# Patient Record
Sex: Female | Born: 1950 | Race: Black or African American | Hispanic: No | Marital: Married | Smoking: Former smoker
Health system: Southern US, Community
[De-identification: ages and names within clinical notes are randomized; demographics above are authoritative.]

## PROBLEM LIST (undated history)

## (undated) DIAGNOSIS — I10 Essential (primary) hypertension: Secondary | ICD-10-CM

## (undated) DIAGNOSIS — G4700 Insomnia, unspecified: Secondary | ICD-10-CM

## (undated) DIAGNOSIS — Z86010 Personal history of colonic polyps: Principal | ICD-10-CM

## (undated) DIAGNOSIS — M797 Fibromyalgia: Secondary | ICD-10-CM

## (undated) HISTORY — DX: Fibromyalgia: M79.7

## (undated) HISTORY — DX: Personal history of colonic polyps: Z86.010

## (undated) HISTORY — DX: Essential (primary) hypertension: I10

## (undated) HISTORY — PX: ABDOMINAL HYSTERECTOMY: SUR658

## (undated) HISTORY — PX: LAPAROSCOPIC GASTRIC BANDING: SHX1100

## (undated) HISTORY — PX: LAPAROSCOPIC GASTRIC SLEEVE RESECTION: SHX5895

## (undated) HISTORY — DX: Insomnia, unspecified: G47.00

---

## 2011-06-12 DIAGNOSIS — I10 Essential (primary) hypertension: Secondary | ICD-10-CM | POA: Diagnosis not present

## 2011-06-12 DIAGNOSIS — E1129 Type 2 diabetes mellitus with other diabetic kidney complication: Secondary | ICD-10-CM | POA: Diagnosis not present

## 2011-06-15 DIAGNOSIS — E1129 Type 2 diabetes mellitus with other diabetic kidney complication: Secondary | ICD-10-CM | POA: Diagnosis not present

## 2011-06-15 DIAGNOSIS — I10 Essential (primary) hypertension: Secondary | ICD-10-CM | POA: Diagnosis not present

## 2011-06-15 DIAGNOSIS — M1710 Unilateral primary osteoarthritis, unspecified knee: Secondary | ICD-10-CM | POA: Diagnosis not present

## 2011-06-15 DIAGNOSIS — M25569 Pain in unspecified knee: Secondary | ICD-10-CM | POA: Diagnosis not present

## 2011-09-21 DIAGNOSIS — E785 Hyperlipidemia, unspecified: Secondary | ICD-10-CM | POA: Diagnosis not present

## 2011-09-21 DIAGNOSIS — E1129 Type 2 diabetes mellitus with other diabetic kidney complication: Secondary | ICD-10-CM | POA: Diagnosis not present

## 2011-09-21 DIAGNOSIS — I10 Essential (primary) hypertension: Secondary | ICD-10-CM | POA: Diagnosis not present

## 2011-10-19 DIAGNOSIS — M1710 Unilateral primary osteoarthritis, unspecified knee: Secondary | ICD-10-CM | POA: Diagnosis not present

## 2011-10-19 DIAGNOSIS — E669 Obesity, unspecified: Secondary | ICD-10-CM | POA: Diagnosis not present

## 2011-11-16 DIAGNOSIS — E669 Obesity, unspecified: Secondary | ICD-10-CM | POA: Diagnosis not present

## 2011-11-16 DIAGNOSIS — R609 Edema, unspecified: Secondary | ICD-10-CM | POA: Diagnosis not present

## 2011-11-16 DIAGNOSIS — G4700 Insomnia, unspecified: Secondary | ICD-10-CM | POA: Diagnosis not present

## 2011-11-27 DIAGNOSIS — M533 Sacrococcygeal disorders, not elsewhere classified: Secondary | ICD-10-CM | POA: Diagnosis not present

## 2011-11-28 DIAGNOSIS — M1990 Unspecified osteoarthritis, unspecified site: Secondary | ICD-10-CM | POA: Diagnosis not present

## 2011-11-28 DIAGNOSIS — M25569 Pain in unspecified knee: Secondary | ICD-10-CM | POA: Diagnosis not present

## 2011-12-18 DIAGNOSIS — E669 Obesity, unspecified: Secondary | ICD-10-CM | POA: Diagnosis not present

## 2011-12-18 DIAGNOSIS — I10 Essential (primary) hypertension: Secondary | ICD-10-CM | POA: Diagnosis not present

## 2012-01-11 DIAGNOSIS — E785 Hyperlipidemia, unspecified: Secondary | ICD-10-CM | POA: Diagnosis not present

## 2012-01-11 DIAGNOSIS — I10 Essential (primary) hypertension: Secondary | ICD-10-CM | POA: Diagnosis not present

## 2012-01-11 DIAGNOSIS — E1129 Type 2 diabetes mellitus with other diabetic kidney complication: Secondary | ICD-10-CM | POA: Diagnosis not present

## 2012-01-18 DIAGNOSIS — E1129 Type 2 diabetes mellitus with other diabetic kidney complication: Secondary | ICD-10-CM | POA: Diagnosis not present

## 2012-01-18 DIAGNOSIS — E785 Hyperlipidemia, unspecified: Secondary | ICD-10-CM | POA: Diagnosis not present

## 2012-01-18 DIAGNOSIS — I10 Essential (primary) hypertension: Secondary | ICD-10-CM | POA: Diagnosis not present

## 2012-02-15 DIAGNOSIS — I10 Essential (primary) hypertension: Secondary | ICD-10-CM | POA: Diagnosis not present

## 2012-02-15 DIAGNOSIS — Z23 Encounter for immunization: Secondary | ICD-10-CM | POA: Diagnosis not present

## 2012-02-15 DIAGNOSIS — E669 Obesity, unspecified: Secondary | ICD-10-CM | POA: Diagnosis not present

## 2012-03-06 DIAGNOSIS — R6882 Decreased libido: Secondary | ICD-10-CM | POA: Diagnosis not present

## 2012-03-06 DIAGNOSIS — Z1231 Encounter for screening mammogram for malignant neoplasm of breast: Secondary | ICD-10-CM | POA: Diagnosis not present

## 2012-03-06 DIAGNOSIS — Z01419 Encounter for gynecological examination (general) (routine) without abnormal findings: Secondary | ICD-10-CM | POA: Diagnosis not present

## 2012-03-06 DIAGNOSIS — Z124 Encounter for screening for malignant neoplasm of cervix: Secondary | ICD-10-CM | POA: Diagnosis not present

## 2012-03-13 DIAGNOSIS — I10 Essential (primary) hypertension: Secondary | ICD-10-CM | POA: Diagnosis not present

## 2012-03-25 DIAGNOSIS — Z79899 Other long term (current) drug therapy: Secondary | ICD-10-CM | POA: Diagnosis not present

## 2012-03-25 DIAGNOSIS — G894 Chronic pain syndrome: Secondary | ICD-10-CM | POA: Diagnosis not present

## 2012-03-25 DIAGNOSIS — R51 Headache: Secondary | ICD-10-CM | POA: Diagnosis not present

## 2012-03-25 DIAGNOSIS — M4714 Other spondylosis with myelopathy, thoracic region: Secondary | ICD-10-CM | POA: Diagnosis not present

## 2012-04-11 DIAGNOSIS — G894 Chronic pain syndrome: Secondary | ICD-10-CM | POA: Diagnosis not present

## 2012-04-11 DIAGNOSIS — IMO0001 Reserved for inherently not codable concepts without codable children: Secondary | ICD-10-CM | POA: Diagnosis not present

## 2012-04-11 DIAGNOSIS — Z79899 Other long term (current) drug therapy: Secondary | ICD-10-CM | POA: Diagnosis not present

## 2012-04-15 DIAGNOSIS — I10 Essential (primary) hypertension: Secondary | ICD-10-CM | POA: Diagnosis not present

## 2012-04-15 DIAGNOSIS — Z713 Dietary counseling and surveillance: Secondary | ICD-10-CM | POA: Diagnosis not present

## 2012-05-10 DIAGNOSIS — Z5181 Encounter for therapeutic drug level monitoring: Secondary | ICD-10-CM | POA: Diagnosis not present

## 2012-05-10 DIAGNOSIS — G894 Chronic pain syndrome: Secondary | ICD-10-CM | POA: Diagnosis not present

## 2012-05-10 DIAGNOSIS — M5137 Other intervertebral disc degeneration, lumbosacral region: Secondary | ICD-10-CM | POA: Diagnosis not present

## 2012-05-10 DIAGNOSIS — Z79899 Other long term (current) drug therapy: Secondary | ICD-10-CM | POA: Diagnosis not present

## 2012-06-04 DIAGNOSIS — G935 Compression of brain: Secondary | ICD-10-CM | POA: Diagnosis not present

## 2012-06-04 DIAGNOSIS — I10 Essential (primary) hypertension: Secondary | ICD-10-CM | POA: Diagnosis not present

## 2012-06-18 DIAGNOSIS — IMO0001 Reserved for inherently not codable concepts without codable children: Secondary | ICD-10-CM | POA: Diagnosis not present

## 2012-06-18 DIAGNOSIS — G894 Chronic pain syndrome: Secondary | ICD-10-CM | POA: Diagnosis not present

## 2012-06-18 DIAGNOSIS — M5030 Other cervical disc degeneration, unspecified cervical region: Secondary | ICD-10-CM | POA: Diagnosis not present

## 2012-06-18 DIAGNOSIS — M5137 Other intervertebral disc degeneration, lumbosacral region: Secondary | ICD-10-CM | POA: Diagnosis not present

## 2012-06-18 DIAGNOSIS — Z79899 Other long term (current) drug therapy: Secondary | ICD-10-CM | POA: Diagnosis not present

## 2012-06-24 DIAGNOSIS — E1129 Type 2 diabetes mellitus with other diabetic kidney complication: Secondary | ICD-10-CM | POA: Diagnosis not present

## 2012-06-24 DIAGNOSIS — I10 Essential (primary) hypertension: Secondary | ICD-10-CM | POA: Diagnosis not present

## 2012-06-24 DIAGNOSIS — E785 Hyperlipidemia, unspecified: Secondary | ICD-10-CM | POA: Diagnosis not present

## 2012-07-09 DIAGNOSIS — Z0189 Encounter for other specified special examinations: Secondary | ICD-10-CM | POA: Diagnosis not present

## 2012-07-09 DIAGNOSIS — Z7182 Exercise counseling: Secondary | ICD-10-CM | POA: Diagnosis not present

## 2012-07-09 DIAGNOSIS — Z5181 Encounter for therapeutic drug level monitoring: Secondary | ICD-10-CM | POA: Diagnosis not present

## 2012-07-09 DIAGNOSIS — Z79899 Other long term (current) drug therapy: Secondary | ICD-10-CM | POA: Diagnosis not present

## 2012-07-19 DIAGNOSIS — G4730 Sleep apnea, unspecified: Secondary | ICD-10-CM | POA: Diagnosis not present

## 2012-07-19 DIAGNOSIS — R5382 Chronic fatigue, unspecified: Secondary | ICD-10-CM | POA: Diagnosis not present

## 2012-07-19 DIAGNOSIS — G4710 Hypersomnia, unspecified: Secondary | ICD-10-CM | POA: Diagnosis not present

## 2012-07-19 DIAGNOSIS — G479 Sleep disorder, unspecified: Secondary | ICD-10-CM | POA: Diagnosis not present

## 2012-07-19 DIAGNOSIS — G4700 Insomnia, unspecified: Secondary | ICD-10-CM | POA: Diagnosis not present

## 2012-07-24 DIAGNOSIS — M5137 Other intervertebral disc degeneration, lumbosacral region: Secondary | ICD-10-CM | POA: Diagnosis not present

## 2012-07-24 DIAGNOSIS — M4714 Other spondylosis with myelopathy, thoracic region: Secondary | ICD-10-CM | POA: Diagnosis not present

## 2012-07-24 DIAGNOSIS — Z5181 Encounter for therapeutic drug level monitoring: Secondary | ICD-10-CM | POA: Diagnosis not present

## 2012-07-24 DIAGNOSIS — M5030 Other cervical disc degeneration, unspecified cervical region: Secondary | ICD-10-CM | POA: Diagnosis not present

## 2012-07-24 DIAGNOSIS — Z79899 Other long term (current) drug therapy: Secondary | ICD-10-CM | POA: Diagnosis not present

## 2012-07-24 DIAGNOSIS — G894 Chronic pain syndrome: Secondary | ICD-10-CM | POA: Diagnosis not present

## 2012-08-21 DIAGNOSIS — F329 Major depressive disorder, single episode, unspecified: Secondary | ICD-10-CM | POA: Diagnosis not present

## 2012-08-22 DIAGNOSIS — F329 Major depressive disorder, single episode, unspecified: Secondary | ICD-10-CM | POA: Diagnosis not present

## 2012-08-28 DIAGNOSIS — G894 Chronic pain syndrome: Secondary | ICD-10-CM | POA: Diagnosis not present

## 2012-08-28 DIAGNOSIS — Z5181 Encounter for therapeutic drug level monitoring: Secondary | ICD-10-CM | POA: Diagnosis not present

## 2012-08-28 DIAGNOSIS — M5137 Other intervertebral disc degeneration, lumbosacral region: Secondary | ICD-10-CM | POA: Diagnosis not present

## 2012-08-28 DIAGNOSIS — M5030 Other cervical disc degeneration, unspecified cervical region: Secondary | ICD-10-CM | POA: Diagnosis not present

## 2012-08-28 DIAGNOSIS — Z79899 Other long term (current) drug therapy: Secondary | ICD-10-CM | POA: Diagnosis not present

## 2012-09-30 DIAGNOSIS — E785 Hyperlipidemia, unspecified: Secondary | ICD-10-CM | POA: Diagnosis not present

## 2012-09-30 DIAGNOSIS — E1129 Type 2 diabetes mellitus with other diabetic kidney complication: Secondary | ICD-10-CM | POA: Diagnosis not present

## 2012-09-30 DIAGNOSIS — I10 Essential (primary) hypertension: Secondary | ICD-10-CM | POA: Diagnosis not present

## 2012-10-10 DIAGNOSIS — Z01818 Encounter for other preprocedural examination: Secondary | ICD-10-CM | POA: Diagnosis not present

## 2012-10-10 DIAGNOSIS — E119 Type 2 diabetes mellitus without complications: Secondary | ICD-10-CM | POA: Diagnosis not present

## 2012-10-15 DIAGNOSIS — G4733 Obstructive sleep apnea (adult) (pediatric): Secondary | ICD-10-CM | POA: Diagnosis not present

## 2012-10-15 DIAGNOSIS — Z6841 Body Mass Index (BMI) 40.0 and over, adult: Secondary | ICD-10-CM | POA: Diagnosis not present

## 2012-10-15 DIAGNOSIS — M5030 Other cervical disc degeneration, unspecified cervical region: Secondary | ICD-10-CM | POA: Diagnosis present

## 2012-10-15 DIAGNOSIS — K219 Gastro-esophageal reflux disease without esophagitis: Secondary | ICD-10-CM | POA: Diagnosis present

## 2012-10-15 DIAGNOSIS — K449 Diaphragmatic hernia without obstruction or gangrene: Secondary | ICD-10-CM | POA: Diagnosis not present

## 2012-10-15 DIAGNOSIS — F3289 Other specified depressive episodes: Secondary | ICD-10-CM | POA: Diagnosis not present

## 2012-10-15 DIAGNOSIS — G894 Chronic pain syndrome: Secondary | ICD-10-CM | POA: Diagnosis not present

## 2012-10-15 DIAGNOSIS — I10 Essential (primary) hypertension: Secondary | ICD-10-CM | POA: Diagnosis present

## 2012-10-15 DIAGNOSIS — F329 Major depressive disorder, single episode, unspecified: Secondary | ICD-10-CM | POA: Diagnosis present

## 2012-10-15 DIAGNOSIS — M5137 Other intervertebral disc degeneration, lumbosacral region: Secondary | ICD-10-CM | POA: Diagnosis present

## 2012-10-28 DIAGNOSIS — Z5181 Encounter for therapeutic drug level monitoring: Secondary | ICD-10-CM | POA: Diagnosis not present

## 2012-10-28 DIAGNOSIS — M5030 Other cervical disc degeneration, unspecified cervical region: Secondary | ICD-10-CM | POA: Diagnosis not present

## 2012-10-28 DIAGNOSIS — G894 Chronic pain syndrome: Secondary | ICD-10-CM | POA: Diagnosis not present

## 2012-10-28 DIAGNOSIS — Z79899 Other long term (current) drug therapy: Secondary | ICD-10-CM | POA: Diagnosis not present

## 2012-11-14 DIAGNOSIS — R7989 Other specified abnormal findings of blood chemistry: Secondary | ICD-10-CM | POA: Diagnosis not present

## 2012-11-14 DIAGNOSIS — Z0189 Encounter for other specified special examinations: Secondary | ICD-10-CM | POA: Diagnosis not present

## 2012-12-12 DIAGNOSIS — R1310 Dysphagia, unspecified: Secondary | ICD-10-CM | POA: Diagnosis not present

## 2012-12-26 DIAGNOSIS — R1310 Dysphagia, unspecified: Secondary | ICD-10-CM | POA: Diagnosis not present

## 2012-12-26 DIAGNOSIS — K225 Diverticulum of esophagus, acquired: Secondary | ICD-10-CM | POA: Diagnosis not present

## 2012-12-26 DIAGNOSIS — R0982 Postnasal drip: Secondary | ICD-10-CM | POA: Diagnosis not present

## 2012-12-26 DIAGNOSIS — K219 Gastro-esophageal reflux disease without esophagitis: Secondary | ICD-10-CM | POA: Diagnosis not present

## 2012-12-27 DIAGNOSIS — G894 Chronic pain syndrome: Secondary | ICD-10-CM | POA: Diagnosis not present

## 2012-12-27 DIAGNOSIS — Z79899 Other long term (current) drug therapy: Secondary | ICD-10-CM | POA: Diagnosis not present

## 2012-12-27 DIAGNOSIS — Z5181 Encounter for therapeutic drug level monitoring: Secondary | ICD-10-CM | POA: Diagnosis not present

## 2012-12-27 DIAGNOSIS — M5137 Other intervertebral disc degeneration, lumbosacral region: Secondary | ICD-10-CM | POA: Diagnosis not present

## 2012-12-27 DIAGNOSIS — M5030 Other cervical disc degeneration, unspecified cervical region: Secondary | ICD-10-CM | POA: Diagnosis not present

## 2013-01-01 DIAGNOSIS — E1129 Type 2 diabetes mellitus with other diabetic kidney complication: Secondary | ICD-10-CM | POA: Diagnosis not present

## 2013-01-01 DIAGNOSIS — I10 Essential (primary) hypertension: Secondary | ICD-10-CM | POA: Diagnosis not present

## 2013-01-06 DIAGNOSIS — E1129 Type 2 diabetes mellitus with other diabetic kidney complication: Secondary | ICD-10-CM | POA: Diagnosis not present

## 2013-01-06 DIAGNOSIS — I10 Essential (primary) hypertension: Secondary | ICD-10-CM | POA: Diagnosis not present

## 2013-01-06 DIAGNOSIS — E785 Hyperlipidemia, unspecified: Secondary | ICD-10-CM | POA: Diagnosis not present

## 2013-02-24 DIAGNOSIS — M4310 Spondylolisthesis, site unspecified: Secondary | ICD-10-CM | POA: Diagnosis not present

## 2013-02-24 DIAGNOSIS — M4802 Spinal stenosis, cervical region: Secondary | ICD-10-CM | POA: Diagnosis not present

## 2013-02-24 DIAGNOSIS — M5030 Other cervical disc degeneration, unspecified cervical region: Secondary | ICD-10-CM | POA: Diagnosis not present

## 2013-02-24 DIAGNOSIS — Z79899 Other long term (current) drug therapy: Secondary | ICD-10-CM | POA: Diagnosis not present

## 2013-02-24 DIAGNOSIS — Z5181 Encounter for therapeutic drug level monitoring: Secondary | ICD-10-CM | POA: Diagnosis not present

## 2013-02-24 DIAGNOSIS — G894 Chronic pain syndrome: Secondary | ICD-10-CM | POA: Diagnosis not present

## 2013-04-01 DIAGNOSIS — R51 Headache: Secondary | ICD-10-CM | POA: Diagnosis not present

## 2013-04-01 DIAGNOSIS — Q398 Other congenital malformations of esophagus: Secondary | ICD-10-CM | POA: Diagnosis not present

## 2013-04-01 DIAGNOSIS — R0982 Postnasal drip: Secondary | ICD-10-CM | POA: Diagnosis not present

## 2013-04-01 DIAGNOSIS — R439 Unspecified disturbances of smell and taste: Secondary | ICD-10-CM | POA: Diagnosis not present

## 2013-04-08 DIAGNOSIS — J343 Hypertrophy of nasal turbinates: Secondary | ICD-10-CM | POA: Diagnosis not present

## 2013-04-08 DIAGNOSIS — J3489 Other specified disorders of nose and nasal sinuses: Secondary | ICD-10-CM | POA: Diagnosis not present

## 2013-04-08 DIAGNOSIS — J329 Chronic sinusitis, unspecified: Secondary | ICD-10-CM | POA: Diagnosis not present

## 2013-04-08 DIAGNOSIS — R6889 Other general symptoms and signs: Secondary | ICD-10-CM | POA: Diagnosis not present

## 2013-04-08 DIAGNOSIS — R439 Unspecified disturbances of smell and taste: Secondary | ICD-10-CM | POA: Diagnosis not present

## 2013-04-08 DIAGNOSIS — J342 Deviated nasal septum: Secondary | ICD-10-CM | POA: Diagnosis not present

## 2013-05-07 DIAGNOSIS — Z23 Encounter for immunization: Secondary | ICD-10-CM | POA: Diagnosis not present

## 2013-05-30 DIAGNOSIS — R6882 Decreased libido: Secondary | ICD-10-CM | POA: Diagnosis not present

## 2013-05-30 DIAGNOSIS — Z1231 Encounter for screening mammogram for malignant neoplasm of breast: Secondary | ICD-10-CM | POA: Diagnosis not present

## 2013-06-27 DIAGNOSIS — M5137 Other intervertebral disc degeneration, lumbosacral region: Secondary | ICD-10-CM | POA: Diagnosis not present

## 2013-06-27 DIAGNOSIS — Z5181 Encounter for therapeutic drug level monitoring: Secondary | ICD-10-CM | POA: Diagnosis not present

## 2013-06-27 DIAGNOSIS — M4310 Spondylolisthesis, site unspecified: Secondary | ICD-10-CM | POA: Diagnosis not present

## 2013-06-27 DIAGNOSIS — Z79899 Other long term (current) drug therapy: Secondary | ICD-10-CM | POA: Diagnosis not present

## 2013-06-27 DIAGNOSIS — M5030 Other cervical disc degeneration, unspecified cervical region: Secondary | ICD-10-CM | POA: Diagnosis not present

## 2013-06-27 DIAGNOSIS — G894 Chronic pain syndrome: Secondary | ICD-10-CM | POA: Diagnosis not present

## 2013-07-16 DIAGNOSIS — I10 Essential (primary) hypertension: Secondary | ICD-10-CM | POA: Diagnosis not present

## 2013-07-16 DIAGNOSIS — E1129 Type 2 diabetes mellitus with other diabetic kidney complication: Secondary | ICD-10-CM | POA: Diagnosis not present

## 2013-07-25 DIAGNOSIS — Z5181 Encounter for therapeutic drug level monitoring: Secondary | ICD-10-CM | POA: Diagnosis not present

## 2013-07-25 DIAGNOSIS — IMO0001 Reserved for inherently not codable concepts without codable children: Secondary | ICD-10-CM | POA: Diagnosis not present

## 2013-07-25 DIAGNOSIS — Z79899 Other long term (current) drug therapy: Secondary | ICD-10-CM | POA: Diagnosis not present

## 2013-07-25 DIAGNOSIS — M461 Sacroiliitis, not elsewhere classified: Secondary | ICD-10-CM | POA: Diagnosis not present

## 2013-07-25 DIAGNOSIS — M5137 Other intervertebral disc degeneration, lumbosacral region: Secondary | ICD-10-CM | POA: Diagnosis not present

## 2013-07-25 DIAGNOSIS — G894 Chronic pain syndrome: Secondary | ICD-10-CM | POA: Diagnosis not present

## 2013-07-25 DIAGNOSIS — M4310 Spondylolisthesis, site unspecified: Secondary | ICD-10-CM | POA: Diagnosis not present

## 2013-07-25 DIAGNOSIS — M4802 Spinal stenosis, cervical region: Secondary | ICD-10-CM | POA: Diagnosis not present

## 2013-07-25 DIAGNOSIS — M533 Sacrococcygeal disorders, not elsewhere classified: Secondary | ICD-10-CM | POA: Diagnosis not present

## 2013-07-25 DIAGNOSIS — M5030 Other cervical disc degeneration, unspecified cervical region: Secondary | ICD-10-CM | POA: Diagnosis not present

## 2013-08-11 DIAGNOSIS — E785 Hyperlipidemia, unspecified: Secondary | ICD-10-CM | POA: Diagnosis not present

## 2013-08-11 DIAGNOSIS — E1129 Type 2 diabetes mellitus with other diabetic kidney complication: Secondary | ICD-10-CM | POA: Diagnosis not present

## 2013-08-11 DIAGNOSIS — I10 Essential (primary) hypertension: Secondary | ICD-10-CM | POA: Diagnosis not present

## 2013-08-11 DIAGNOSIS — N183 Chronic kidney disease, stage 3 unspecified: Secondary | ICD-10-CM | POA: Diagnosis not present

## 2013-09-25 DIAGNOSIS — L600 Ingrowing nail: Secondary | ICD-10-CM | POA: Diagnosis not present

## 2013-09-25 DIAGNOSIS — B351 Tinea unguium: Secondary | ICD-10-CM | POA: Diagnosis not present

## 2013-10-17 DIAGNOSIS — G894 Chronic pain syndrome: Secondary | ICD-10-CM | POA: Diagnosis not present

## 2013-10-17 DIAGNOSIS — M4802 Spinal stenosis, cervical region: Secondary | ICD-10-CM | POA: Diagnosis not present

## 2013-10-17 DIAGNOSIS — M5030 Other cervical disc degeneration, unspecified cervical region: Secondary | ICD-10-CM | POA: Diagnosis not present

## 2013-10-17 DIAGNOSIS — M5137 Other intervertebral disc degeneration, lumbosacral region: Secondary | ICD-10-CM | POA: Diagnosis not present

## 2013-12-11 DIAGNOSIS — M546 Pain in thoracic spine: Secondary | ICD-10-CM | POA: Diagnosis not present

## 2013-12-11 DIAGNOSIS — Z5181 Encounter for therapeutic drug level monitoring: Secondary | ICD-10-CM | POA: Diagnosis not present

## 2013-12-11 DIAGNOSIS — IMO0001 Reserved for inherently not codable concepts without codable children: Secondary | ICD-10-CM | POA: Diagnosis not present

## 2013-12-11 DIAGNOSIS — G894 Chronic pain syndrome: Secondary | ICD-10-CM | POA: Diagnosis not present

## 2014-03-02 DIAGNOSIS — G894 Chronic pain syndrome: Secondary | ICD-10-CM | POA: Diagnosis not present

## 2014-03-02 DIAGNOSIS — M546 Pain in thoracic spine: Secondary | ICD-10-CM | POA: Diagnosis not present

## 2014-03-02 DIAGNOSIS — Z5181 Encounter for therapeutic drug level monitoring: Secondary | ICD-10-CM | POA: Diagnosis not present

## 2014-03-02 DIAGNOSIS — M791 Myalgia: Secondary | ICD-10-CM | POA: Diagnosis not present

## 2014-03-09 DIAGNOSIS — Z0000 Encounter for general adult medical examination without abnormal findings: Secondary | ICD-10-CM | POA: Diagnosis not present

## 2014-03-09 DIAGNOSIS — Z23 Encounter for immunization: Secondary | ICD-10-CM | POA: Diagnosis not present

## 2014-03-09 DIAGNOSIS — Z1389 Encounter for screening for other disorder: Secondary | ICD-10-CM | POA: Diagnosis not present

## 2014-03-09 DIAGNOSIS — E538 Deficiency of other specified B group vitamins: Secondary | ICD-10-CM | POA: Diagnosis not present

## 2014-03-09 DIAGNOSIS — I10 Essential (primary) hypertension: Secondary | ICD-10-CM | POA: Diagnosis not present

## 2014-03-09 DIAGNOSIS — E1121 Type 2 diabetes mellitus with diabetic nephropathy: Secondary | ICD-10-CM | POA: Diagnosis not present

## 2014-03-09 DIAGNOSIS — M859 Disorder of bone density and structure, unspecified: Secondary | ICD-10-CM | POA: Diagnosis not present

## 2014-03-16 DIAGNOSIS — I10 Essential (primary) hypertension: Secondary | ICD-10-CM | POA: Diagnosis not present

## 2014-03-16 DIAGNOSIS — E785 Hyperlipidemia, unspecified: Secondary | ICD-10-CM | POA: Diagnosis not present

## 2014-03-16 DIAGNOSIS — N183 Chronic kidney disease, stage 3 (moderate): Secondary | ICD-10-CM | POA: Diagnosis not present

## 2014-03-16 DIAGNOSIS — M859 Disorder of bone density and structure, unspecified: Secondary | ICD-10-CM | POA: Diagnosis not present

## 2014-04-02 DIAGNOSIS — M5134 Other intervertebral disc degeneration, thoracic region: Secondary | ICD-10-CM | POA: Diagnosis not present

## 2014-04-02 DIAGNOSIS — M47814 Spondylosis without myelopathy or radiculopathy, thoracic region: Secondary | ICD-10-CM | POA: Diagnosis not present

## 2014-04-02 DIAGNOSIS — M546 Pain in thoracic spine: Secondary | ICD-10-CM | POA: Diagnosis not present

## 2014-04-23 DIAGNOSIS — Z23 Encounter for immunization: Secondary | ICD-10-CM | POA: Diagnosis not present

## 2014-06-04 DIAGNOSIS — M546 Pain in thoracic spine: Secondary | ICD-10-CM | POA: Diagnosis not present

## 2014-06-04 DIAGNOSIS — G894 Chronic pain syndrome: Secondary | ICD-10-CM | POA: Diagnosis not present

## 2014-06-04 DIAGNOSIS — M5134 Other intervertebral disc degeneration, thoracic region: Secondary | ICD-10-CM | POA: Diagnosis not present

## 2014-06-05 DIAGNOSIS — Z1231 Encounter for screening mammogram for malignant neoplasm of breast: Secondary | ICD-10-CM | POA: Diagnosis not present

## 2014-06-05 DIAGNOSIS — Z1289 Encounter for screening for malignant neoplasm of other sites: Secondary | ICD-10-CM | POA: Diagnosis not present

## 2014-08-26 DIAGNOSIS — Z79899 Other long term (current) drug therapy: Secondary | ICD-10-CM | POA: Diagnosis not present

## 2014-08-26 DIAGNOSIS — M546 Pain in thoracic spine: Secondary | ICD-10-CM | POA: Diagnosis not present

## 2014-08-26 DIAGNOSIS — Z5181 Encounter for therapeutic drug level monitoring: Secondary | ICD-10-CM | POA: Diagnosis not present

## 2014-08-26 DIAGNOSIS — G894 Chronic pain syndrome: Secondary | ICD-10-CM | POA: Diagnosis not present

## 2014-08-26 DIAGNOSIS — M791 Myalgia: Secondary | ICD-10-CM | POA: Diagnosis not present

## 2014-08-26 DIAGNOSIS — M5134 Other intervertebral disc degeneration, thoracic region: Secondary | ICD-10-CM | POA: Diagnosis not present

## 2014-08-26 DIAGNOSIS — M4802 Spinal stenosis, cervical region: Secondary | ICD-10-CM | POA: Diagnosis not present

## 2014-09-17 DIAGNOSIS — D81818 Other biotin-dependent carboxylase deficiency: Secondary | ICD-10-CM | POA: Diagnosis not present

## 2014-09-17 DIAGNOSIS — E785 Hyperlipidemia, unspecified: Secondary | ICD-10-CM | POA: Diagnosis not present

## 2014-09-17 DIAGNOSIS — M859 Disorder of bone density and structure, unspecified: Secondary | ICD-10-CM | POA: Diagnosis not present

## 2014-09-17 DIAGNOSIS — I10 Essential (primary) hypertension: Secondary | ICD-10-CM | POA: Diagnosis not present

## 2014-09-25 DIAGNOSIS — G894 Chronic pain syndrome: Secondary | ICD-10-CM | POA: Diagnosis not present

## 2014-09-25 DIAGNOSIS — Z5181 Encounter for therapeutic drug level monitoring: Secondary | ICD-10-CM | POA: Diagnosis not present

## 2014-09-25 DIAGNOSIS — M5030 Other cervical disc degeneration, unspecified cervical region: Secondary | ICD-10-CM | POA: Diagnosis not present

## 2014-09-25 DIAGNOSIS — M546 Pain in thoracic spine: Secondary | ICD-10-CM | POA: Diagnosis not present

## 2014-09-25 DIAGNOSIS — M4317 Spondylolisthesis, lumbosacral region: Secondary | ICD-10-CM | POA: Diagnosis not present

## 2014-09-25 DIAGNOSIS — M791 Myalgia: Secondary | ICD-10-CM | POA: Diagnosis not present

## 2014-09-25 DIAGNOSIS — M5134 Other intervertebral disc degeneration, thoracic region: Secondary | ICD-10-CM | POA: Diagnosis not present

## 2014-09-28 DIAGNOSIS — E785 Hyperlipidemia, unspecified: Secondary | ICD-10-CM | POA: Diagnosis not present

## 2014-09-28 DIAGNOSIS — N183 Chronic kidney disease, stage 3 (moderate): Secondary | ICD-10-CM | POA: Diagnosis not present

## 2014-09-28 DIAGNOSIS — I10 Essential (primary) hypertension: Secondary | ICD-10-CM | POA: Diagnosis not present

## 2014-09-28 DIAGNOSIS — M8580 Other specified disorders of bone density and structure, unspecified site: Secondary | ICD-10-CM | POA: Diagnosis not present

## 2014-09-29 ENCOUNTER — Encounter: Payer: Self-pay | Admitting: Internal Medicine

## 2014-10-22 DIAGNOSIS — G44229 Chronic tension-type headache, not intractable: Secondary | ICD-10-CM | POA: Diagnosis not present

## 2014-10-22 DIAGNOSIS — R432 Parageusia: Secondary | ICD-10-CM | POA: Diagnosis not present

## 2014-10-22 DIAGNOSIS — R51 Headache: Secondary | ICD-10-CM | POA: Diagnosis not present

## 2014-11-20 ENCOUNTER — Encounter: Payer: Self-pay | Admitting: Internal Medicine

## 2014-11-25 DIAGNOSIS — G894 Chronic pain syndrome: Secondary | ICD-10-CM | POA: Diagnosis not present

## 2014-11-25 DIAGNOSIS — M4802 Spinal stenosis, cervical region: Secondary | ICD-10-CM | POA: Diagnosis not present

## 2014-11-25 DIAGNOSIS — M545 Low back pain: Secondary | ICD-10-CM | POA: Diagnosis not present

## 2014-11-25 DIAGNOSIS — M546 Pain in thoracic spine: Secondary | ICD-10-CM | POA: Diagnosis not present

## 2014-11-25 DIAGNOSIS — M5030 Other cervical disc degeneration, unspecified cervical region: Secondary | ICD-10-CM | POA: Diagnosis not present

## 2014-11-25 DIAGNOSIS — M5134 Other intervertebral disc degeneration, thoracic region: Secondary | ICD-10-CM | POA: Diagnosis not present

## 2014-11-25 DIAGNOSIS — M5137 Other intervertebral disc degeneration, lumbosacral region: Secondary | ICD-10-CM | POA: Diagnosis not present

## 2014-11-25 DIAGNOSIS — Z5181 Encounter for therapeutic drug level monitoring: Secondary | ICD-10-CM | POA: Diagnosis not present

## 2014-12-11 ENCOUNTER — Encounter: Payer: Self-pay | Admitting: Internal Medicine

## 2015-01-26 ENCOUNTER — Ambulatory Visit (AMBULATORY_SURGERY_CENTER): Payer: Self-pay

## 2015-01-26 VITALS — Ht 62.0 in | Wt 165.8 lb

## 2015-01-26 DIAGNOSIS — Z1211 Encounter for screening for malignant neoplasm of colon: Secondary | ICD-10-CM

## 2015-01-26 NOTE — Progress Notes (Signed)
No allergies to eggs or soy No past problems with anesthesia No diet/weight loss meds No home oxygen  Refused emmi 

## 2015-02-08 ENCOUNTER — Encounter: Payer: Self-pay | Admitting: Internal Medicine

## 2015-02-08 ENCOUNTER — Telehealth: Payer: Self-pay | Admitting: Internal Medicine

## 2015-02-08 DIAGNOSIS — Z23 Encounter for immunization: Secondary | ICD-10-CM | POA: Diagnosis not present

## 2015-02-08 NOTE — Telephone Encounter (Signed)
No charge. 

## 2015-02-15 DIAGNOSIS — M546 Pain in thoracic spine: Secondary | ICD-10-CM | POA: Diagnosis not present

## 2015-02-15 DIAGNOSIS — M542 Cervicalgia: Secondary | ICD-10-CM | POA: Diagnosis not present

## 2015-02-15 DIAGNOSIS — M5030 Other cervical disc degeneration, unspecified cervical region: Secondary | ICD-10-CM | POA: Diagnosis not present

## 2015-02-15 DIAGNOSIS — G894 Chronic pain syndrome: Secondary | ICD-10-CM | POA: Diagnosis not present

## 2015-02-15 DIAGNOSIS — M545 Low back pain: Secondary | ICD-10-CM | POA: Diagnosis not present

## 2015-02-17 ENCOUNTER — Encounter: Payer: Self-pay | Admitting: Internal Medicine

## 2015-02-17 ENCOUNTER — Ambulatory Visit (AMBULATORY_SURGERY_CENTER): Payer: Medicare Other | Admitting: Internal Medicine

## 2015-02-17 VITALS — BP 183/88 | HR 61 | Temp 95.8°F | Resp 15 | Ht 62.0 in | Wt 165.0 lb

## 2015-02-17 DIAGNOSIS — Z1211 Encounter for screening for malignant neoplasm of colon: Secondary | ICD-10-CM

## 2015-02-17 DIAGNOSIS — I10 Essential (primary) hypertension: Secondary | ICD-10-CM | POA: Diagnosis not present

## 2015-02-17 DIAGNOSIS — D124 Benign neoplasm of descending colon: Secondary | ICD-10-CM | POA: Diagnosis not present

## 2015-02-17 DIAGNOSIS — D120 Benign neoplasm of cecum: Secondary | ICD-10-CM

## 2015-02-17 MED ORDER — SODIUM CHLORIDE 0.9 % IV SOLN: 500.0000 mL | INTRAVENOUS | Status: DC

## 2015-02-17 NOTE — Patient Instructions (Addendum)
I found and removed 2 small polyps that look benign.  You also have a condition called diverticulosis - common and not usually a problem. Please read the handout provided.  I will let you know pathology results and when to have another routine colonoscopy by mail.  I appreciate the opportunity to care for you. Gatha Mayer, MD, [facility]  Discharge instructions given. Handouts on polyps and diverticulosis. Resume previous medications. YOU HAD AN ENDOSCOPIC PROCEDURE TODAY AT [facility]:   Refer to the procedure report that was given to you for any specific questions about what was found during the examination.  If the procedure report does not answer your questions, please call your gastroenterologist to clarify.  If you requested that your care partner not be given the details of your procedure findings, then the procedure report has been included in a sealed envelope for you to review at your convenience later.  YOU SHOULD EXPECT: Some feelings of bloating in the abdomen. Passage of more gas than usual.  Walking can help get rid of the air that was put into your GI tract during the procedure and reduce the bloating. If you had a lower endoscopy (such as a colonoscopy or flexible sigmoidoscopy) you may notice spotting of blood in your stool or on the toilet paper. If you underwent a bowel prep for your procedure, you may not have a normal bowel movement for a few days.  Please Note:  You might notice some irritation and congestion in your nose or some drainage.  This is from the oxygen used during your procedure.  There is no need for concern and it should clear up in a day or so.  SYMPTOMS TO REPORT IMMEDIATELY:   Following lower endoscopy (colonoscopy or flexible sigmoidoscopy):  Excessive amounts of blood in the stool  Significant tenderness or worsening of abdominal pains  Swelling of the abdomen that is new, acute  Fever of 100F or higher   For urgent or  emergent issues, a gastroenterologist can be reached at any hour by calling [phone].   DIET: Your first meal following the procedure should be a small meal and then it is ok to progress to your normal diet. Heavy or fried foods are harder to digest and may make you feel nauseous or bloated.  Likewise, meals heavy in dairy and vegetables can increase bloating.  Drink plenty of fluids but you should avoid alcoholic beverages for 24 hours.  ACTIVITY:  You should plan to take it easy for the rest of today and you should NOT DRIVE or use heavy machinery until tomorrow (because of the sedation medicines used during the test).    FOLLOW UP: Our staff will call the number listed on your records the next business day following your procedure to check on you and address any questions or concerns that you may have regarding the information given to you following your procedure. If we do not reach you, we will leave a message.  However, if you are feeling well and you are not experiencing any problems, there is no need to return our call.  We will assume that you have returned to your regular daily activities without incident.  If any biopsies were taken you will be contacted by phone or by letter within the next 1-3 weeks.  Please call us at [phone] if you have not heard about the biopsies in 3 weeks.    SIGNATURES/CONFIDENTIALITY: You and/or your care partner have signed  paperwork which will be entered into your electronic medical record.  These signatures attest to the fact that that the information above on your After Visit Summary has been reviewed and is understood.  Full responsibility of the confidentiality of this discharge information lies with you and/or your care-partner.

## 2015-02-17 NOTE — Progress Notes (Signed)
Called to room to assist during endoscopic procedure.  Patient ID and intended procedure confirmed with present staff. Received instructions for my participation in the procedure from the performing physician.  

## 2015-02-17 NOTE — Op Note (Signed)
[place]  [address] [place], [id number]   COLONOSCOPY PROCEDURE REPORT  PATIENT: Mary Berger, Doi  MR#: [id number] BIRTHDATE: 18-Oct-1950 , 17  yrs. old GENDER: female ENDOSCOPIST: Gatha Mayer, MD, [facility] PROCEDURE DATE:  02/17/2015 PROCEDURE:   Colonoscopy, screening, Colonoscopy with biopsy, and Colonoscopy with snare polypectomy First Screening Colonoscopy - Avg.  risk and is 50 yrs.  old or older - No.  Prior Negative Screening - Now for repeat screening. 10 or more years since last screening  History of Adenoma - Now for follow-up colonoscopy & has been > or = to 3 yrs.  N/A  Polyps removed today? Yes ASA CLASS:   Class II INDICATIONS:Screening for colonic neoplasia and Colorectal Neoplasm Risk Assessment for this procedure is average risk. MEDICATIONS: Propofol 250 mg IV and Monitored anesthesia care  DESCRIPTION OF PROCEDURE:   After the risks benefits and alternatives of the procedure were thoroughly explained, informed consent was obtained.  The digital rectal exam revealed no abnormalities of the rectum.   The [id number]  endoscope was introduced through the anus and advanced to the cecum, which was identified by both the appendix and ileocecal valve. No adverse events experienced.   The quality of the prep was adequate (MiraLax was used)  The instrument was then slowly withdrawn as the colon was fully examined. Estimated blood loss is zero unless otherwise noted in this procedure report.   COLON FINDINGS: Two polypoid shaped sessile polyps ranging from 2 to 51mm in size were found at the cecum and in the descending colon. Polypectomies were performed with cold forceps and with a cold snare.  The resection was complete, the polyp tissue was completely retrieved and sent to histology.   There was diverticulosis noted in the sigmoid colon.   The examination was otherwise normal. Retroflexed views revealed no abnormalities. The time to  cecum = 5.6 Withdrawal time = 14.6   The scope was withdrawn and the procedure completed. COMPLICATIONS: There were no immediate complications.  ENDOSCOPIC IMPRESSION: 1.   Two sessile polyps ranging from 2 to 28mm in size were found at the cecum and in the descending colon; polypectomies were performed with cold forceps and with a cold snare 2.   Diverticulosis was noted in the sigmoid colon 3.   The examination was otherwise normal - adequate prep - second screen  RECOMMENDATIONS: Timing of repeat colonoscopy will be determined by pathology findings.  eSigned:  Gatha Mayer, MD, [facility] 02/17/2015 8:35 AM   cc: Dr. Thressa Sheller and The Patient

## 2015-02-17 NOTE — Progress Notes (Signed)
Transferred to recovery room. A/O x3, pleased with MAC.  VSS.  Report to Celia, RN. 

## 2015-02-18 ENCOUNTER — Telehealth: Payer: Self-pay | Admitting: *Deleted

## 2015-02-18 NOTE — Telephone Encounter (Signed)
  Follow up Call-  Call back number 02/17/2015  Post procedure Call Back phone  # [phone]  Permission to leave phone message Yes     Patient questions:  Do you have a fever, pain , or abdominal swelling? No. Pain Score  0 *  Have you tolerated food without any problems? Yes.    Have you been able to return to your normal activities? Yes.    Do you have any questions about your discharge instructions: Diet   No. Medications  No. Follow up visit  No.  Do you have questions or concerns about your Care? No.  Actions: * If pain score is 4 or above: No action needed, pain <4.

## 2015-02-24 ENCOUNTER — Encounter: Payer: Self-pay | Admitting: Internal Medicine

## 2015-02-24 DIAGNOSIS — Z86010 Personal history of colonic polyps: Secondary | ICD-10-CM

## 2015-02-24 DIAGNOSIS — Z860101 Personal history of adenomatous and serrated colon polyps: Secondary | ICD-10-CM

## 2015-02-24 HISTORY — DX: Personal history of colonic polyps: Z86.010

## 2015-02-24 HISTORY — DX: Personal history of adenomatous and serrated colon polyps: Z86.0101

## 2015-02-24 NOTE — Progress Notes (Signed)
Quick Note:  dimin adenoma x 2 repeat colonoscopy 2021 ______

## 2015-03-25 DIAGNOSIS — M8580 Other specified disorders of bone density and structure, unspecified site: Secondary | ICD-10-CM | POA: Diagnosis not present

## 2015-03-25 DIAGNOSIS — E559 Vitamin D deficiency, unspecified: Secondary | ICD-10-CM | POA: Diagnosis not present

## 2015-03-25 DIAGNOSIS — I10 Essential (primary) hypertension: Secondary | ICD-10-CM | POA: Diagnosis not present

## 2015-03-29 DIAGNOSIS — Z1389 Encounter for screening for other disorder: Secondary | ICD-10-CM | POA: Diagnosis not present

## 2015-03-29 DIAGNOSIS — I129 Hypertensive chronic kidney disease with stage 1 through stage 4 chronic kidney disease, or unspecified chronic kidney disease: Secondary | ICD-10-CM | POA: Diagnosis not present

## 2015-03-29 DIAGNOSIS — Z0000 Encounter for general adult medical examination without abnormal findings: Secondary | ICD-10-CM | POA: Diagnosis not present

## 2015-03-29 DIAGNOSIS — E785 Hyperlipidemia, unspecified: Secondary | ICD-10-CM | POA: Diagnosis not present

## 2015-04-01 DIAGNOSIS — F339 Major depressive disorder, recurrent, unspecified: Secondary | ICD-10-CM | POA: Diagnosis not present

## 2015-04-01 DIAGNOSIS — I129 Hypertensive chronic kidney disease with stage 1 through stage 4 chronic kidney disease, or unspecified chronic kidney disease: Secondary | ICD-10-CM | POA: Diagnosis not present

## 2015-04-01 DIAGNOSIS — F17211 Nicotine dependence, cigarettes, in remission: Secondary | ICD-10-CM | POA: Diagnosis not present

## 2015-04-01 DIAGNOSIS — N183 Chronic kidney disease, stage 3 (moderate): Secondary | ICD-10-CM | POA: Diagnosis not present

## 2015-04-01 DIAGNOSIS — E785 Hyperlipidemia, unspecified: Secondary | ICD-10-CM | POA: Diagnosis not present

## 2015-05-11 DIAGNOSIS — M546 Pain in thoracic spine: Secondary | ICD-10-CM | POA: Diagnosis not present

## 2015-05-11 DIAGNOSIS — M545 Low back pain: Secondary | ICD-10-CM | POA: Diagnosis not present

## 2015-05-11 DIAGNOSIS — G894 Chronic pain syndrome: Secondary | ICD-10-CM | POA: Diagnosis not present

## 2015-05-11 DIAGNOSIS — Z5181 Encounter for therapeutic drug level monitoring: Secondary | ICD-10-CM | POA: Diagnosis not present

## 2015-05-11 DIAGNOSIS — Z79899 Other long term (current) drug therapy: Secondary | ICD-10-CM | POA: Diagnosis not present

## 2015-05-11 DIAGNOSIS — M542 Cervicalgia: Secondary | ICD-10-CM | POA: Diagnosis not present

## 2015-05-11 DIAGNOSIS — G8929 Other chronic pain: Secondary | ICD-10-CM | POA: Diagnosis not present

## 2015-05-11 DIAGNOSIS — M5030 Other cervical disc degeneration, unspecified cervical region: Secondary | ICD-10-CM | POA: Diagnosis not present

## 2015-09-28 DIAGNOSIS — E559 Vitamin D deficiency, unspecified: Secondary | ICD-10-CM | POA: Diagnosis not present

## 2015-09-28 DIAGNOSIS — I129 Hypertensive chronic kidney disease with stage 1 through stage 4 chronic kidney disease, or unspecified chronic kidney disease: Secondary | ICD-10-CM | POA: Diagnosis not present

## 2015-09-28 DIAGNOSIS — M8580 Other specified disorders of bone density and structure, unspecified site: Secondary | ICD-10-CM | POA: Diagnosis not present

## 2016-07-03 ENCOUNTER — Other Ambulatory Visit: Payer: Self-pay | Admitting: Pain Medicine

## 2016-09-12 ENCOUNTER — Other Ambulatory Visit: Payer: Self-pay | Admitting: Internal Medicine

## 2016-09-12 DIAGNOSIS — R0989 Other specified symptoms and signs involving the circulatory and respiratory systems: Secondary | ICD-10-CM

## 2016-09-12 DIAGNOSIS — M79604 Pain in right leg: Secondary | ICD-10-CM

## 2016-09-12 DIAGNOSIS — M79605 Pain in left leg: Principal | ICD-10-CM

## 2016-09-13 ENCOUNTER — Ambulatory Visit
Admission: RE | Admit: 2016-09-13 | Discharge: 2016-09-13 | Disposition: A | Discharge status: Home / Self Care | Payer: Medicare Other | Source: Ambulatory Visit | Attending: Internal Medicine | Admitting: Internal Medicine

## 2016-09-13 DIAGNOSIS — M79605 Pain in left leg: Principal | ICD-10-CM

## 2016-09-13 DIAGNOSIS — M79604 Pain in right leg: Secondary | ICD-10-CM

## 2016-09-19 ENCOUNTER — Other Ambulatory Visit: Payer: Medicare Other

## 2016-09-19 ENCOUNTER — Ambulatory Visit
Admission: RE | Admit: 2016-09-19 | Discharge: 2016-09-19 | Disposition: A | Discharge status: Home / Self Care | Payer: Medicare Other | Source: Ambulatory Visit | Attending: Internal Medicine | Admitting: Internal Medicine

## 2016-09-19 DIAGNOSIS — R0989 Other specified symptoms and signs involving the circulatory and respiratory systems: Secondary | ICD-10-CM

## 2016-09-19 DIAGNOSIS — M79605 Pain in left leg: Principal | ICD-10-CM

## 2016-09-19 DIAGNOSIS — M79604 Pain in right leg: Secondary | ICD-10-CM

## 2018-01-22 ENCOUNTER — Other Ambulatory Visit: Payer: Self-pay | Admitting: Orthopaedic Surgery

## 2018-01-22 DIAGNOSIS — M4326 Fusion of spine, lumbar region: Secondary | ICD-10-CM

## 2018-01-23 ENCOUNTER — Telehealth: Payer: Self-pay | Admitting: Nurse Practitioner

## 2018-01-23 NOTE — Telephone Encounter (Signed)
Phone call to patient to verify medication list and allergies for myelogram procedure. Pt instructed to hold wellbutrin, BC powder and cymbalta 48 hrs prior myelogram appointment time. Pt verbalized understanding.

## 2018-02-13 ENCOUNTER — Other Ambulatory Visit: Payer: Medicare Other

## 2018-02-26 ENCOUNTER — Other Ambulatory Visit: Payer: Medicare Other

## 2018-03-06 ENCOUNTER — Ambulatory Visit
Admission: RE | Admit: 2018-03-06 | Discharge: 2018-03-06 | Disposition: A | Discharge status: Home / Self Care | Payer: Medicare Other | Source: Ambulatory Visit | Attending: Orthopaedic Surgery | Admitting: Orthopaedic Surgery

## 2018-03-06 DIAGNOSIS — M4326 Fusion of spine, lumbar region: Secondary | ICD-10-CM

## 2018-03-06 MED ORDER — IOPAMIDOL (ISOVUE-M 200) INJECTION 41%
15.0000 mL | Freq: Once | INTRAMUSCULAR | Status: AC
  Administered 2018-03-06: 15 mL via INTRATHECAL

## 2018-03-06 MED ORDER — DIAZEPAM 5 MG PO TABS
10.0000 mg | ORAL_TABLET | Freq: Once | ORAL | Status: AC
  Administered 2018-03-06: 10 mg via ORAL

## 2018-03-06 NOTE — Discharge Instructions (Signed)
Myelogram Discharge Instructions  1. Go home and rest quietly for the next 24 hours.  It is important to lie flat for the next 24 hours.  Get up only to go to the restroom.  You may lie in the bed or on a couch on your back, your stomach, your left side or your right side.  You may have one pillow under your head.  You may have pillows between your knees while you are on your side or under your knees while you are on your back.  2. DO NOT drive today.  Recline the seat as far back as it will go, while still wearing your seat belt, on the way home.  3. You may get up to go to the bathroom as needed.  You may sit up for 10 minutes to eat.  You may resume your normal diet and medications unless otherwise indicated.  Drink lots of extra fluids today and tomorrow.  4. The incidence of headache, nausea, or vomiting is about 5% (one in 20 patients).  If you develop a headache, lie flat and drink plenty of fluids until the headache goes away.  Caffeinated beverages may be helpful.  If you develop severe nausea and vomiting or a headache that does not go away with flat bed rest, call [phone].  5. You may resume normal activities after your 24 hours of bed rest is over; however, do not exert yourself strongly or do any heavy lifting tomorrow. If when you get up you have a headache when standing, go back to bed and force fluids for another 24 hours.  6. Call your physician for a follow-up appointment.  The results of your myelogram will be sent directly to your physician by the following day.  7. If you have any questions or if complications develop after you arrive home, please call [phone].  Discharge instructions have been explained to the patient.  The patient, or the person responsible for the patient, fully understands these instructions.   YOU MAY RESTART YOUR CYMBALTA AND WELLBUTRIN TOMORROW 03/07/2018 AT 1:00PM.

## 2018-10-31 ENCOUNTER — Other Ambulatory Visit: Payer: Self-pay

## 2018-10-31 ENCOUNTER — Encounter (HOSPITAL_BASED_OUTPATIENT_CLINIC_OR_DEPARTMENT_OTHER): Payer: Self-pay | Admitting: *Deleted

## 2018-10-31 ENCOUNTER — Emergency Department (HOSPITAL_BASED_OUTPATIENT_CLINIC_OR_DEPARTMENT_OTHER)
Admission: EM | Admit: 2018-10-31 | Discharge: 2018-10-31 | Disposition: A | Discharge status: Home / Self Care | Payer: Medicare Other | Attending: Emergency Medicine | Admitting: Emergency Medicine

## 2018-10-31 DIAGNOSIS — Z87891 Personal history of nicotine dependence: Secondary | ICD-10-CM | POA: Insufficient documentation

## 2018-10-31 DIAGNOSIS — R21 Rash and other nonspecific skin eruption: Secondary | ICD-10-CM | POA: Diagnosis present

## 2018-10-31 DIAGNOSIS — I10 Essential (primary) hypertension: Secondary | ICD-10-CM | POA: Diagnosis not present

## 2018-10-31 DIAGNOSIS — Z79899 Other long term (current) drug therapy: Secondary | ICD-10-CM | POA: Insufficient documentation

## 2018-10-31 MED ORDER — TRIAMCINOLONE ACETONIDE 0.1 % EX CREA: 1.0000 "application " | TOPICAL_CREAM | Freq: Two times a day (BID) | CUTANEOUS | 0 refills | Status: AC

## 2018-10-31 MED ORDER — PERMETHRIN 5 % EX CREA: TOPICAL_CREAM | CUTANEOUS | 0 refills | Status: AC

## 2018-10-31 NOTE — Discharge Instructions (Addendum)
Try triamcinolone steroid cream daily but not on the face. If no improvement in 1 week try the permethrin cream that you put on for 10 hrs then wash off.  Follow up with primary and dermatology if no improvement with these options.

## 2018-10-31 NOTE — ED Provider Notes (Signed)
[facility] EMERGENCY DEPARTMENT Provider Note   CSN: [id number] Arrival date & time: 10/31/18  1445     History   Chief Complaint Chief Complaint  Patient presents with  . Rash    HPI Mary Berger is a 68 y.o. female.     Patient with hx of htn and fibromyalgia, no ned medicine presents with worsening rash for 1 mo.  Pt had it last year and saw dermatology, topical treatment stopped progression however pt does not recall name of treatment or diagnosis.  No fevers or systemic sxs.  Mild itchy.  Worse trunk.  No travel, new furniture, hotels or new contacts.  Husband does not have similar rash.      Past Medical History:  Diagnosis Date  . Fibromyalgia   . Hx of adenomatous colonic polyps 02/24/2015  . Hypertension   . Insomnia     Patient Active Problem List   Diagnosis Date Noted  . Hx of adenomatous colonic polyps 02/24/2015    Past Surgical History:  Procedure Laterality Date  . ABDOMINAL HYSTERECTOMY    . CESAREAN SECTION    . LAPAROSCOPIC GASTRIC BANDING     later removed  . LAPAROSCOPIC GASTRIC SLEEVE RESECTION       OB History   No obstetric history on file.      Home Medications    Prior to Admission medications   Medication Sig Start Date End Date Taking? Authorizing Provider  amLODipine (NORVASC) 10 MG tablet Take 10 mg by mouth daily.   Yes [provider]  buPROPion (WELLBUTRIN SR) 150 MG 12 hr tablet Take 150 mg by mouth 2 (two) times daily.   Yes [provider]  CHONDROITIN SULFATE A PO Take by mouth 2 (two) times daily.   Yes [provider]  cloNIDine (CATAPRES) 0.1 MG tablet Take 0.1 mg by mouth 2 (two) times daily.   Yes [provider]  DULoxetine (CYMBALTA) 30 MG capsule Take 30 mg by mouth daily.   Yes [provider]  MORPHINE SULFATE IR PO Take by mouth.   Yes [provider]  Multiple Vitamin (MULTIVITAMIN) tablet Take 1 tablet by mouth daily.   Yes [provider]  OVER THE COUNTER MEDICATION b c powder   Yes [provider]  zolpidem (AMBIEN) 10 MG tablet Take 10 mg by mouth at bedtime as needed for sleep.   Yes [provider]  hydrochlorothiazide (HYDRODIURIL) 25 MG tablet Take 25 mg by mouth daily.    [provider]  methocarbamol (ROBAXIN) 500 MG tablet Take 500 mg by mouth 4 (four) times daily.    [provider]  permethrin (ELIMITE) 5 % cream Apply to affected area once 10/31/18   Elnora Morrison, MD  triamcinolone cream (KENALOG) 0.1 % Apply 1 application topically 2 (two) times daily for 7 days. Not on face 10/31/18 11/07/18  Elnora Morrison, MD    Family History Family History  Problem Relation Age of Onset  . Colon polyps Neg Hx   . Colon cancer Neg Hx     Social History Social History   Tobacco Use  . Smoking status: Former Research scientist (life sciences)  . Smokeless tobacco: Never Used  Substance Use Topics  . Alcohol use: No    Alcohol/week: 0.0 standard drinks  . Drug use: No     Allergies   Sulfa antibiotics   Review of Systems Review of Systems  Constitutional: Negative for chills and fever.  HENT: Negative for congestion.  Eyes: Negative for visual disturbance.  Respiratory: Negative for shortness of breath.   Cardiovascular: Negative for chest pain.  Gastrointestinal: Negative for abdominal pain and vomiting.  Genitourinary: Negative for dysuria and flank pain.  Musculoskeletal: Negative for back pain, neck pain and neck stiffness.  Skin: Positive for rash.  Neurological: Negative for light-headedness and headaches.     Physical Exam Updated Vital Signs BP (!) 152/84   Pulse 77   Temp 98.1 F (36.7 C) (Oral)   Resp 18   Ht 5\' 2"  (1.575 m)   Wt 98.2 kg   SpO2 99%   BMI 39.58 kg/m   Physical Exam Vitals signs and nursing note reviewed.  Constitutional:      Appearance: She is well-developed.  HENT:     Head: Normocephalic and atraumatic.  Eyes:     General:         Right eye: No discharge.        Left eye: No discharge.  Neck:     Trachea: No tracheal deviation.  Cardiovascular:     Rate and Rhythm: Normal rate.  Pulmonary:     Effort: Pulmonary effort is normal.  Abdominal:     General: There is no distension.     Palpations: Abdomen is soft.     Tenderness: There is no abdominal tenderness. There is no guarding.  Skin:    General: Skin is warm.     Findings: Rash present.     Comments: Pt has multiple lesions in different stages, some dried/ healing, others linear with excoriations. WOrse on upper chest/ arms.  Few on face.  NO warmth or surrounding erythema  Neurological:     Mental Status: She is alert and oriented to person, place, and time.  Psychiatric:        Mood and Affect: Mood normal.      ED Treatments / Results  Labs (all labs ordered are listed, but only abnormal results are displayed) Labs Reviewed - No data to display  EKG    Radiology No results found.  Procedures Procedures (including critical care time)  Medications Ordered in ED Medications - No data to display   Initial Impression / Assessment and Plan / ED Course  I have reviewed the triage vital signs and the nursing notes.  Pertinent labs & imaging results that were available during my care of the patient were reviewed by me and considered in my medical decision making (see chart for details).       Pt presents with non-specific rash for one month.   Inflammatory component.  Discussed trial of steroids and if no improvement in 1 week trying permethrin cream.  Pt will make dermatology appt as well.   Results and differential diagnosis were discussed with the patient/parent/guardian. Xrays were independently reviewed by myself.  Close follow up outpatient was discussed, comfortable with the plan.   Medications - No data to display  Vitals:   10/31/18 1449 10/31/18 1452  BP:  (!) 152/84  Pulse:  77  Resp:  18  Temp:  98.1 F (36.7 C)   TempSrc:  Oral  SpO2:  99%  Weight: 98.2 kg   Height: 5\' 2"  (1.575 m)     Final diagnoses:  Rash and nonspecific skin eruption     Final Clinical Impressions(s) / ED Diagnoses   Final diagnoses:  Rash and nonspecific skin eruption    ED Discharge Orders         Ordered    triamcinolone  cream (KENALOG) 0.1 %  2 times daily     10/31/18 1532    permethrin (ELIMITE) 5 % cream     10/31/18 1532           Elnora Morrison, MD 10/31/18 1538

## 2018-10-31 NOTE — ED Triage Notes (Signed)
Rash over her body for a month. She was seen by a dermatologist for same a year ago and it didn't get worse.

## 2019-01-01 ENCOUNTER — Encounter (HOSPITAL_BASED_OUTPATIENT_CLINIC_OR_DEPARTMENT_OTHER): Payer: Self-pay

## 2019-01-01 ENCOUNTER — Emergency Department (HOSPITAL_BASED_OUTPATIENT_CLINIC_OR_DEPARTMENT_OTHER)
Admission: EM | Admit: 2019-01-01 | Discharge: 2019-01-01 | Disposition: A | Discharge status: Home / Self Care | Payer: Medicare Other | Attending: Emergency Medicine | Admitting: Emergency Medicine

## 2019-01-01 ENCOUNTER — Other Ambulatory Visit: Payer: Self-pay

## 2019-01-01 DIAGNOSIS — Z87891 Personal history of nicotine dependence: Secondary | ICD-10-CM | POA: Diagnosis not present

## 2019-01-01 DIAGNOSIS — I10 Essential (primary) hypertension: Secondary | ICD-10-CM

## 2019-01-01 DIAGNOSIS — Z79899 Other long term (current) drug therapy: Secondary | ICD-10-CM | POA: Diagnosis not present

## 2019-01-01 NOTE — Discharge Instructions (Signed)
You were seen in the ED tonight for concern of high blood pressure after starting prednisone Your blood pressure in the ED has remained stable and within normal limits Your neuro exam was also very reassuring tonight Prednisone does not typically cause elevations in blood pressure. I would recommend continue taking this as prescribed for your hip pain.  I would recommend checking your blood pressure only when you have concerning symptoms including headache, vision changes, speech difficulties, weakness or numbness on one side of your body.  Please follow up with your PCP.

## 2019-01-01 NOTE — ED Provider Notes (Signed)
[facility] EMERGENCY DEPARTMENT Provider Note   CSN: [id number] Arrival date & time: 01/01/19  2011    History   Chief Complaint Chief Complaint  Patient presents with  . Hypertension    HPI Mary Berger is a 68 y.o. female with hx of HTN who presents to the ED today for concern of high blood pressure. Pt reports she started taking Prednisone yesterday for her hip pain after a steroid injection did not help. Pt did not want to start prednisone as her husband had a bad reaction a few years ago where he "went crazy." Pt reports that she took 2 tablets last night and when she woke up this morning she decided to check her blood pressure which was 096 systolic. Pt reports she checked her blood pressure about 15-20 more times and it remainded high causing her concern. She waited to come tonight as she was worried she would have to stay in the hospital. Pt has no complaints currently.       Past Medical History:  Diagnosis Date  . Fibromyalgia   . Hx of adenomatous colonic polyps 02/24/2015  . Hypertension   . Insomnia     Patient Active Problem List   Diagnosis Date Noted  . Hx of adenomatous colonic polyps 02/24/2015    Past Surgical History:  Procedure Laterality Date  . ABDOMINAL HYSTERECTOMY    . CESAREAN SECTION    . LAPAROSCOPIC GASTRIC BANDING     later removed  . LAPAROSCOPIC GASTRIC SLEEVE RESECTION       OB History   No obstetric history on file.      Home Medications    Prior to Admission medications   Medication Sig Start Date End Date Taking? Authorizing Provider  amLODipine (NORVASC) 10 MG tablet Take 10 mg by mouth daily.    [provider]  buPROPion (WELLBUTRIN SR) 150 MG 12 hr tablet Take 150 mg by mouth 2 (two) times daily.    [provider]  CHONDROITIN SULFATE A PO Take by mouth 2 (two) times daily.    [provider]  cloNIDine (CATAPRES) 0.1 MG tablet Take 0.1 mg by mouth 2 (two) times daily.     [provider]  DULoxetine (CYMBALTA) 30 MG capsule Take 30 mg by mouth daily.    [provider]  hydrochlorothiazide (HYDRODIURIL) 25 MG tablet Take 25 mg by mouth daily.    [provider]  methocarbamol (ROBAXIN) 500 MG tablet Take 500 mg by mouth 4 (four) times daily.    [provider]  MORPHINE SULFATE IR PO Take by mouth.    [provider]  Multiple Vitamin (MULTIVITAMIN) tablet Take 1 tablet by mouth daily.    [provider]  OVER THE COUNTER MEDICATION b c powder    [provider]  permethrin (ELIMITE) 5 % cream Apply to affected area once 10/31/18   Elnora Morrison, MD  zolpidem (AMBIEN) 10 MG tablet Take 10 mg by mouth at bedtime as needed for sleep.    [provider]    Family History Family History  Problem Relation Age of Onset  . Colon polyps Neg Hx   . Colon cancer Neg Hx     Social History Social History   Tobacco Use  . Smoking status: Former Research scientist (life sciences)  . Smokeless tobacco: Never Used  Substance Use Topics  . Alcohol use: No    Alcohol/week: 0.0 standard drinks  . Drug use: No     Allergies  Sulfa antibiotics   Review of Systems Review of Systems  All other systems reviewed and are negative.    Physical Exam Updated Vital Signs BP (!) 148/80   Pulse 70   Temp 98.2 F (36.8 C) (Oral)   Resp 18   Ht 5\' 2"  (1.575 m)   Wt 77.1 kg   BMI 31.09 kg/m   Physical Exam Vitals signs and nursing note reviewed.  Constitutional:      Appearance: She is not ill-appearing.  HENT:     Head: Normocephalic and atraumatic.  Eyes:     Conjunctiva/sclera: Conjunctivae normal.  Cardiovascular:     Rate and Rhythm: Normal rate and regular rhythm.  Pulmonary:     Effort: Pulmonary effort is normal.     Breath sounds: Normal breath sounds.  Skin:    General: Skin is warm and dry.     Coloration: Skin is not jaundiced.  Neurological:     Mental Status: She is alert.     Comments: CN  3-12 grossly intact A&O x4 GCS 15 Sensation and strength intact Gait nonataxic including with tandem walking Coordination with finger-to-nose WNL Neg romberg, neg pronator drift       ED Treatments / Results  Labs (all labs ordered are listed, but only abnormal results are displayed) Labs Reviewed - No data to display  EKG None  Radiology No results found.  Procedures Procedures (including critical care time)  Medications Ordered in ED Medications - No data to display   Initial Impression / Assessment and Plan / ED Course  I have reviewed the triage vital signs and the nursing notes.  Pertinent labs & imaging results that were available during my care of the patient were reviewed by me and considered in my medical decision making (see chart for details).    68 year old female presenting with concerns of high blood pressure after starting prednisone yesterday for left hip pain. She reports systolics as high as 287. Upon arrival pts blood pressure 153/94. Pt has no complaints currently. It appears she thought prednisone would increase her blood pressure. Believe she may have worked herself up this morning by worrying and continuously checking her BP. She has no focal neuro deficits on exam today. Visualized her ambulating to the restroom without difficulty as well. Rechecked BP 148/80. Encouraged pt to only check her BP if she is having concerning symptoms. Advised to continue taking prednisone as prescribed and to follow up with her PCP. Pt is in agreement with plan and stable for discharge home.       Final Clinical Impressions(s) / ED Diagnoses   Final diagnoses:  Essential hypertension    ED Discharge Orders    None       Eustaquio Maize, PA-C 01/01/19 2306    Fredia Sorrow, MD 01/04/19 [phone]

## 2019-01-01 NOTE — ED Notes (Signed)
ED Provider at bedside. 

## 2019-01-01 NOTE — ED Triage Notes (Signed)
Pt c/o elevated BP today-states she is compliant with BP med-concerned it may be r/t to rx prednizone yesterday for hip pain-NAD-to triage in w/c

## 2019-01-20 ENCOUNTER — Other Ambulatory Visit: Payer: Self-pay | Admitting: Rehabilitation

## 2019-01-20 DIAGNOSIS — M25552 Pain in left hip: Secondary | ICD-10-CM

## 2020-06-22 ENCOUNTER — Encounter: Payer: Self-pay | Admitting: Internal Medicine

## 2021-05-12 ENCOUNTER — Other Ambulatory Visit: Payer: Self-pay | Admitting: Obstetrics and Gynecology

## 2021-05-12 DIAGNOSIS — Z1231 Encounter for screening mammogram for malignant neoplasm of breast: Secondary | ICD-10-CM

## 2021-06-09 ENCOUNTER — Ambulatory Visit: Payer: Medicare Other

## 2021-06-27 ENCOUNTER — Ambulatory Visit: Payer: Medicare Other

## 2021-07-12 ENCOUNTER — Encounter (INDEPENDENT_AMBULATORY_CARE_PROVIDER_SITE_OTHER): Payer: Self-pay

## 2021-07-12 ENCOUNTER — Ambulatory Visit
Admission: RE | Admit: 2021-07-12 | Discharge: 2021-07-12 | Disposition: A | Discharge status: Home / Self Care | Payer: Medicare Other | Source: Ambulatory Visit | Attending: Obstetrics and Gynecology | Admitting: Obstetrics and Gynecology

## 2021-07-12 DIAGNOSIS — Z1231 Encounter for screening mammogram for malignant neoplasm of breast: Secondary | ICD-10-CM

## 2021-07-14 ENCOUNTER — Other Ambulatory Visit: Payer: Self-pay | Admitting: Obstetrics and Gynecology

## 2021-07-14 DIAGNOSIS — R928 Other abnormal and inconclusive findings on diagnostic imaging of breast: Secondary | ICD-10-CM

## 2021-07-27 ENCOUNTER — Ambulatory Visit
Admission: RE | Admit: 2021-07-27 | Discharge: 2021-07-27 | Disposition: A | Discharge status: Home / Self Care | Payer: Medicare Other | Source: Ambulatory Visit | Attending: Obstetrics and Gynecology | Admitting: Obstetrics and Gynecology

## 2021-07-27 ENCOUNTER — Other Ambulatory Visit: Payer: Self-pay | Admitting: Obstetrics and Gynecology

## 2021-07-27 DIAGNOSIS — R928 Other abnormal and inconclusive findings on diagnostic imaging of breast: Secondary | ICD-10-CM

## 2021-08-09 ENCOUNTER — Other Ambulatory Visit (HOSPITAL_COMMUNITY): Payer: Self-pay | Admitting: Diagnostic Radiology

## 2021-08-09 ENCOUNTER — Ambulatory Visit
Admission: RE | Admit: 2021-08-09 | Discharge: 2021-08-09 | Disposition: A | Discharge status: Home / Self Care | Payer: Medicare Other | Source: Ambulatory Visit | Attending: Obstetrics and Gynecology | Admitting: Obstetrics and Gynecology

## 2021-08-09 ENCOUNTER — Other Ambulatory Visit: Payer: Self-pay

## 2021-08-09 DIAGNOSIS — R928 Other abnormal and inconclusive findings on diagnostic imaging of breast: Secondary | ICD-10-CM

## 2022-03-17 ENCOUNTER — Encounter: Payer: Self-pay | Admitting: Internal Medicine

## 2022-08-08 ENCOUNTER — Other Ambulatory Visit: Payer: Self-pay | Admitting: Family Medicine

## 2022-08-08 DIAGNOSIS — Z1231 Encounter for screening mammogram for malignant neoplasm of breast: Secondary | ICD-10-CM

## 2022-09-14 ENCOUNTER — Other Ambulatory Visit (HOSPITAL_COMMUNITY): Payer: Self-pay

## 2022-09-14 MED ORDER — HYDROMORPHONE HCL 4 MG PO TABS
4.0000 mg | ORAL_TABLET | Freq: Every day | ORAL | 0 refills | Status: AC | PRN
  Filled 2022-09-14: qty 136, 29d supply, fill #0
  Filled 2022-09-14: qty 150, 30d supply, fill #0

## 2022-09-22 ENCOUNTER — Ambulatory Visit: Payer: Medicare Other

## 2022-10-10 ENCOUNTER — Other Ambulatory Visit (HOSPITAL_COMMUNITY): Payer: Self-pay

## 2022-10-11 ENCOUNTER — Other Ambulatory Visit (HOSPITAL_COMMUNITY): Payer: Self-pay

## 2022-10-11 MED ORDER — HYDROMORPHONE HCL 4 MG PO TABS
4.0000 mg | ORAL_TABLET | Freq: Every day | ORAL | 0 refills | Status: AC | PRN
  Filled 2022-10-12: qty 30, 6d supply, fill #0

## 2022-10-12 ENCOUNTER — Other Ambulatory Visit (HOSPITAL_COMMUNITY): Payer: Self-pay

## 2022-10-26 ENCOUNTER — Ambulatory Visit: Payer: Medicare Other

## 2022-11-14 ENCOUNTER — Ambulatory Visit
Admission: RE | Admit: 2022-11-14 | Discharge: 2022-11-14 | Disposition: A | Discharge status: Home / Self Care | Payer: Medicare Other | Source: Ambulatory Visit | Attending: Family Medicine | Admitting: Family Medicine

## 2022-11-14 DIAGNOSIS — Z1231 Encounter for screening mammogram for malignant neoplasm of breast: Secondary | ICD-10-CM

## 2023-02-06 ENCOUNTER — Other Ambulatory Visit (HOSPITAL_COMMUNITY): Payer: Self-pay

## 2023-02-06 MED ORDER — HYDROMORPHONE HCL 4 MG PO TABS
4.0000 mg | ORAL_TABLET | Freq: Every day | ORAL | 0 refills | Status: AC | PRN
  Filled 2023-02-06: qty 150, 30d supply, fill #0

## 2023-03-05 ENCOUNTER — Other Ambulatory Visit (HOSPITAL_COMMUNITY): Payer: Self-pay

## 2023-03-05 MED ORDER — HYDROMORPHONE HCL 4 MG PO TABS
4.0000 mg | ORAL_TABLET | Freq: Every day | ORAL | 0 refills | Status: AC | PRN
  Filled 2023-03-05: qty 150, 30d supply, fill #0
  Filled 2023-03-06: qty 150, 32d supply, fill #0

## 2023-03-06 ENCOUNTER — Other Ambulatory Visit (HOSPITAL_BASED_OUTPATIENT_CLINIC_OR_DEPARTMENT_OTHER): Payer: Self-pay

## 2023-03-06 ENCOUNTER — Other Ambulatory Visit (HOSPITAL_COMMUNITY): Payer: Self-pay

## 2023-03-07 ENCOUNTER — Other Ambulatory Visit (HOSPITAL_BASED_OUTPATIENT_CLINIC_OR_DEPARTMENT_OTHER): Payer: Self-pay | Admitting: Anesthesiology

## 2023-03-07 ENCOUNTER — Ambulatory Visit (HOSPITAL_BASED_OUTPATIENT_CLINIC_OR_DEPARTMENT_OTHER)
Admission: RE | Admit: 2023-03-07 | Discharge: 2023-03-07 | Disposition: A | Discharge status: Home / Self Care | Payer: Medicare Other | Source: Ambulatory Visit | Attending: Anesthesiology | Admitting: Anesthesiology

## 2023-03-07 DIAGNOSIS — R52 Pain, unspecified: Secondary | ICD-10-CM

## 2023-03-30 ENCOUNTER — Telehealth: Payer: Medicare Other

## 2023-03-30 ENCOUNTER — Telehealth: Payer: Medicare Other | Admitting: Physician Assistant

## 2023-03-30 DIAGNOSIS — J019 Acute sinusitis, unspecified: Secondary | ICD-10-CM | POA: Diagnosis not present

## 2023-03-30 DIAGNOSIS — B9689 Other specified bacterial agents as the cause of diseases classified elsewhere: Secondary | ICD-10-CM

## 2023-03-30 MED ORDER — AZITHROMYCIN 250 MG PO TABS: ORAL_TABLET | ORAL | 0 refills | Status: AC

## 2023-03-30 NOTE — Patient Instructions (Signed)
Mary Berger, thank you for joining Mary Loveless, PA-C for today's virtual visit.  While this provider is not your primary care provider (PCP), if your PCP is located in our provider database this encounter information will be shared with them immediately following your visit.   A [facility] MyChart account gives you access to today's visit and all your visits, tests, and labs performed at [facility] " click here if you don't have a [facility] MyChart account or go to [email or web address]  Consent: (Patient) Mary Berger provided verbal consent for this virtual visit at the beginning of the encounter.  Current Medications:  Current Outpatient Medications:    azithromycin (ZITHROMAX) 250 MG tablet, Take 2 tablets on day 1, then 1 tablet daily on days 2 through 5, Disp: 6 tablet, Rfl: 0   amLODipine (NORVASC) 10 MG tablet, Take 10 mg by mouth daily., Disp: , Rfl:    buPROPion (WELLBUTRIN SR) 150 MG 12 hr tablet, Take 150 mg by mouth 2 (two) times daily., Disp: , Rfl:    CHONDROITIN SULFATE A PO, Take by mouth 2 (two) times daily., Disp: , Rfl:    cloNIDine (CATAPRES) 0.1 MG tablet, Take 0.1 mg by mouth 2 (two) times daily., Disp: , Rfl:    DULoxetine (CYMBALTA) 30 MG capsule, Take 30 mg by mouth daily., Disp: , Rfl:    hydrochlorothiazide (HYDRODIURIL) 25 MG tablet, Take 25 mg by mouth daily., Disp: , Rfl:    HYDROmorphone (DILAUDID) 4 MG tablet, Take 1 tablet (4 mg total) by mouth 5 (five) times daily as needed for pain, Disp: 150 tablet, Rfl: 0   HYDROmorphone (DILAUDID) 4 MG tablet, Take 1 tablet (4 mg total) by mouth 5 (five) times daily as needed for pain, Disp: 30 tablet, Rfl: 0   HYDROmorphone (DILAUDID) 4 MG tablet, Take 1 tablet (4 mg total) by mouth 5 (five) times daily as needed for pain (stop MSIR), Disp: 150 tablet, Rfl: 0   HYDROmorphone (DILAUDID) 4 MG tablet, Take 1 tablet by mouth 5 times a day as needed for pain --stop MSIR, Disp: 150 tablet,  Rfl: 0   methocarbamol (ROBAXIN) 500 MG tablet, Take 500 mg by mouth 4 (four) times daily., Disp: , Rfl:    MORPHINE SULFATE IR PO, Take by mouth., Disp: , Rfl:    Multiple Vitamin (MULTIVITAMIN) tablet, Take 1 tablet by mouth daily., Disp: , Rfl:    OVER THE COUNTER MEDICATION, b c powder, Disp: , Rfl:    permethrin (ELIMITE) 5 % cream, Apply to affected area once, Disp: 60 g, Rfl: 0   zolpidem (AMBIEN) 10 MG tablet, Take 10 mg by mouth at bedtime as needed for sleep., Disp: , Rfl:    Medications ordered in this encounter:  Meds ordered this encounter  Medications   azithromycin (ZITHROMAX) 250 MG tablet    Sig: Take 2 tablets on day 1, then 1 tablet daily on days 2 through 5    Dispense:  6 tablet    Refill:  0    Order Specific Question:   Supervising Provider    Answer:   Merrilee Jansky [id number]     *If you need refills on other medications prior to your next appointment, please contact your pharmacy*  Follow-Up: Call back or seek an in-person evaluation if the symptoms worsen or if the condition fails to improve as anticipated.   Virtual Care [phone]  Other Instructions Sinus Infection, Adult A sinus infection, also called  sinusitis, is inflammation of your sinuses. Sinuses are hollow spaces in the bones around your face. Your sinuses are located: Around your eyes. In the middle of your forehead. Behind your nose. In your cheekbones. Mucus normally drains out of your sinuses. When your nasal tissues become inflamed or swollen, mucus can become trapped or blocked. This allows bacteria, viruses, and fungi to grow, which leads to infection. Most infections of the sinuses are caused by a virus. A sinus infection can develop quickly. It can last for up to 4 weeks (acute) or for more than 12 weeks (chronic). A sinus infection often develops after a cold. What are the causes? This condition is caused by anything that creates swelling in the sinuses or stops  mucus from draining. This includes: Allergies. Asthma. Infection from bacteria or viruses. Deformities or blockages in your nose or sinuses. Abnormal growths in the nose (nasal polyps). Pollutants, such as chemicals or irritants in the air. Infection from fungi. This is rare. What increases the risk? You are more likely to develop this condition if you: Have a weak body defense system (immune system). Do a lot of swimming or diving. Overuse nasal sprays. Smoke. What are the signs or symptoms? The main symptoms of this condition are pain and a feeling of pressure around the affected sinuses. Other symptoms include: Stuffy nose or congestion that makes it difficult to breathe through your nose. Thick yellow or greenish drainage from your nose. Tenderness, swelling, and warmth over the affected sinuses. A cough that may get worse at night. Decreased sense of smell and taste. Extra mucus that collects in the throat or the back of the nose (postnasal drip) causing a sore throat or bad breath. Tiredness (fatigue). Fever. How is this diagnosed? This condition is diagnosed based on: Your symptoms. Your medical history. A physical exam. Tests to find out if your condition is acute or chronic. This may include: Checking your nose for nasal polyps. Viewing your sinuses using a device that has a light (endoscope). Testing for allergies or bacteria. Imaging tests, such as an MRI or CT scan. In rare cases, a bone biopsy may be done to rule out more serious types of fungal sinus disease. How is this treated? Treatment for a sinus infection depends on the cause and whether your condition is chronic or acute. If caused by a virus, your symptoms should go away on their own within 10 days. You may be given medicines to relieve symptoms. They include: Medicines that shrink swollen nasal passages (decongestants). A spray that eases inflammation of the nostrils (topical intranasal  corticosteroids). Rinses that help get rid of thick mucus in your nose (nasal saline washes). Medicines that treat allergies (antihistamines). Over-the-counter pain relievers. If caused by bacteria, your health care provider may recommend waiting to see if your symptoms improve. Most bacterial infections will get better without antibiotic medicine. You may be given antibiotics if you have: A severe infection. A weak immune system. If caused by narrow nasal passages or nasal polyps, surgery may be needed. Follow these instructions at home: Medicines Take, use, or apply over-the-counter and prescription medicines only as told by your health care provider. These may include nasal sprays. If you were prescribed an antibiotic medicine, take it as told by your health care provider. Do not stop taking the antibiotic even if you start to feel better. Hydrate and humidify  Drink enough fluid to keep your urine pale yellow. Staying hydrated will help to thin your mucus. Use a cool  mist humidifier to keep the humidity level in your home above 50%. Inhale steam for 10-15 minutes, 3-4 times a day, or as told by your health care provider. You can do this in the bathroom while a hot shower is running. Limit your exposure to cool or dry air. Rest Rest as much as possible. Sleep with your head raised (elevated). Make sure you get enough sleep each night. General instructions  Apply a warm, moist washcloth to your face 3-4 times a day or as told by your health care provider. This will help with discomfort. Use nasal saline washes as often as told by your health care provider. Wash your hands often with soap and water to reduce your exposure to germs. If soap and water are not available, use hand sanitizer. Do not smoke. Avoid being around people who are smoking (secondhand smoke). Keep all follow-up visits. This is important. Contact a health care provider if: You have a fever. Your symptoms get  worse. Your symptoms do not improve within 10 days. Get help right away if: You have a severe headache. You have persistent vomiting. You have severe pain or swelling around your face or eyes. You have vision problems. You develop confusion. Your neck is stiff. You have trouble breathing. These symptoms may be an emergency. Get help right away. Call 911. Do not wait to see if the symptoms will go away. Do not drive yourself to the hospital. Summary A sinus infection is soreness and inflammation of your sinuses. Sinuses are hollow spaces in the bones around your face. This condition is caused by nasal tissues that become inflamed or swollen. The swelling traps or blocks the flow of mucus. This allows bacteria, viruses, and fungi to grow, which leads to infection. If you were prescribed an antibiotic medicine, take it as told by your health care provider. Do not stop taking the antibiotic even if you start to feel better. Keep all follow-up visits. This is important. This information is not intended to replace advice given to you by your health care provider. Make sure you discuss any questions you have with your health care provider. Document Revised: 04/12/2021 Document Reviewed: 04/12/2021 Elsevier Patient Education  2024 Elsevier Inc.    If you have been instructed to have an in-person evaluation today at a local Urgent Care facility, please use the link below. It will take you to a list of all of our available [facility], including address, phone number and hours of operation. Please do not delay care.  [facility]  If you or a family member do not have a primary care provider, use the link below to schedule a visit and establish care. When you choose a [facility] primary care physician or advanced practice provider, you gain a long-term partner in health. Find a Primary Care Provider  Learn more about [facility] in-office and virtual care  options: [facility] - Get Care Now

## 2023-03-30 NOTE — Progress Notes (Signed)
Virtual Visit Consent   Mary Berger, you are scheduled for a virtual visit with a [facility] provider today. Just as with appointments in the office, your consent must be obtained to participate. Your consent will be active for this visit and any virtual visit you may have with one of our providers in the next 365 days. If you have a MyChart account, a copy of this consent can be sent to you electronically.  As this is a virtual visit, video technology does not allow for your provider to perform a traditional examination. This may limit your provider's ability to fully assess your condition. If your provider identifies any concerns that need to be evaluated in person or the need to arrange testing (such as labs, EKG, etc.), we will make arrangements to do so. Although advances in technology are sophisticated, we cannot ensure that it will always work on either your end or our end. If the connection with a video visit is poor, the visit may have to be switched to a telephone visit. With either a video or telephone visit, we are not always able to ensure that we have a secure connection.  By engaging in this virtual visit, you consent to the provision of healthcare and authorize for your insurance to be billed (if applicable) for the services provided during this visit. Depending on your insurance coverage, you may receive a charge related to this service.  I need to obtain your verbal consent now. Are you willing to proceed with your visit today? [name] has provided verbal consent on 03/30/2023 for a virtual visit (video or telephone). Margaretann Loveless, PA-C  Date: 03/30/2023 4:27 PM  Virtual Visit via Video Note   I, Margaretann Loveless, connected with  Mary Berger  ([id number], May 13, 1951) on 03/30/23 at  4:15 PM EST by a video-enabled telemedicine application and verified that I am speaking with the correct person using two identifiers.  Location: Patient: Virtual Visit  Location Patient: Home Provider: Virtual Visit Location Provider: Home Office   I discussed the limitations of evaluation and management by telemedicine and the availability of in person appointments. The patient expressed understanding and agreed to proceed.    History of Present Illness: Mary Berger is a 72 y.o. who identifies as a female who was assigned female at birth, and is being seen today for possible sinus infection.  HPI: Sinus Problem This is a new problem. The current episode started 1 to 4 weeks ago (3 weeks). The problem has been gradually worsening since onset. There has been no fever. The pain is moderate. Associated symptoms include congestion, headaches and sinus pressure. Pertinent negatives include no chills, coughing, ear pain, hoarse voice or sore throat. Treatments tried: advil cold and sinus, humidifier. The treatment provided mild relief.     Problems:  Patient Active Problem List   Diagnosis Date Noted   Hx of adenomatous colonic polyps 02/24/2015    Allergies:  Allergies  Allergen Reactions   Sulfa Antibiotics Swelling   Medications:  Current Outpatient Medications:    azithromycin (ZITHROMAX) 250 MG tablet, Take 2 tablets on day 1, then 1 tablet daily on days 2 through 5, Disp: 6 tablet, Rfl: 0   amLODipine (NORVASC) 10 MG tablet, Take 10 mg by mouth daily., Disp: , Rfl:    buPROPion (WELLBUTRIN SR) 150 MG 12 hr tablet, Take 150 mg by mouth 2 (two) times daily., Disp: , Rfl:    CHONDROITIN SULFATE A PO, Take  by mouth 2 (two) times daily., Disp: , Rfl:    cloNIDine (CATAPRES) 0.1 MG tablet, Take 0.1 mg by mouth 2 (two) times daily., Disp: , Rfl:    DULoxetine (CYMBALTA) 30 MG capsule, Take 30 mg by mouth daily., Disp: , Rfl:    hydrochlorothiazide (HYDRODIURIL) 25 MG tablet, Take 25 mg by mouth daily., Disp: , Rfl:    HYDROmorphone (DILAUDID) 4 MG tablet, Take 1 tablet (4 mg total) by mouth 5 (five) times daily as needed for pain, Disp: 150 tablet,  Rfl: 0   HYDROmorphone (DILAUDID) 4 MG tablet, Take 1 tablet (4 mg total) by mouth 5 (five) times daily as needed for pain, Disp: 30 tablet, Rfl: 0   HYDROmorphone (DILAUDID) 4 MG tablet, Take 1 tablet (4 mg total) by mouth 5 (five) times daily as needed for pain (stop MSIR), Disp: 150 tablet, Rfl: 0   HYDROmorphone (DILAUDID) 4 MG tablet, Take 1 tablet by mouth 5 times a day as needed for pain --stop MSIR, Disp: 150 tablet, Rfl: 0   methocarbamol (ROBAXIN) 500 MG tablet, Take 500 mg by mouth 4 (four) times daily., Disp: , Rfl:    MORPHINE SULFATE IR PO, Take by mouth., Disp: , Rfl:    Multiple Vitamin (MULTIVITAMIN) tablet, Take 1 tablet by mouth daily., Disp: , Rfl:    OVER THE COUNTER MEDICATION, b c powder, Disp: , Rfl:    permethrin (ELIMITE) 5 % cream, Apply to affected area once, Disp: 60 g, Rfl: 0   zolpidem (AMBIEN) 10 MG tablet, Take 10 mg by mouth at bedtime as needed for sleep., Disp: , Rfl:   Observations/Objective: Patient is well-developed, well-nourished in no acute distress.  Resting comfortably at home.  Head is normocephalic, atraumatic.  No labored breathing.  Speech is clear and coherent with logical content.  Patient is alert and oriented at baseline.    Assessment and Plan: 1. Acute bacterial sinusitis - azithromycin (ZITHROMAX) 250 MG tablet; Take 2 tablets on day 1, then 1 tablet daily on days 2 through 5  Dispense: 6 tablet; Refill: 0  - Worsening symptoms that have not responded to OTC medications.  - Will give Zpack - Continue allergy medications.  - Steam and humidifier can help - Stay well hydrated and get plenty of rest.  - Seek in person evaluation if no symptom improvement or if symptoms worsen   Follow Up Instructions: I discussed the assessment and treatment plan with the patient. The patient was provided an opportunity to ask questions and all were answered. The patient agreed with the plan and demonstrated an understanding of the instructions.  A  copy of instructions were sent to the patient via MyChart unless otherwise noted below.    The patient was advised to call back or seek an in-person evaluation if the symptoms worsen or if the condition fails to improve as anticipated.    Margaretann Loveless, PA-C

## 2023-07-03 ENCOUNTER — Other Ambulatory Visit (HOSPITAL_COMMUNITY): Payer: Self-pay

## 2023-07-03 MED ORDER — HYDROMORPHONE HCL 4 MG PO TABS
4.0000 mg | ORAL_TABLET | Freq: Every day | ORAL | 0 refills | Status: AC | PRN
  Filled 2023-08-01: qty 150, 30d supply, fill #0

## 2023-07-03 MED ORDER — HYDROMORPHONE HCL 4 MG PO TABS
4.0000 mg | ORAL_TABLET | Freq: Every day | ORAL | 0 refills | Status: AC | PRN
  Filled 2023-07-03: qty 150, 30d supply, fill #0

## 2023-07-31 ENCOUNTER — Other Ambulatory Visit (HOSPITAL_COMMUNITY): Payer: Self-pay

## 2023-08-01 ENCOUNTER — Other Ambulatory Visit (HOSPITAL_COMMUNITY): Payer: Self-pay

## 2023-08-02 ENCOUNTER — Other Ambulatory Visit (HOSPITAL_COMMUNITY): Payer: Self-pay

## 2023-10-23 ENCOUNTER — Other Ambulatory Visit: Payer: Self-pay | Admitting: Family Medicine

## 2023-10-23 DIAGNOSIS — Z1231 Encounter for screening mammogram for malignant neoplasm of breast: Secondary | ICD-10-CM

## 2023-11-15 ENCOUNTER — Ambulatory Visit

## 2023-11-22 ENCOUNTER — Ambulatory Visit
Admission: RE | Admit: 2023-11-22 | Discharge: 2023-11-22 | Disposition: A | Discharge status: Home / Self Care | Source: Ambulatory Visit | Attending: Family Medicine | Admitting: Family Medicine

## 2023-11-22 DIAGNOSIS — Z1231 Encounter for screening mammogram for malignant neoplasm of breast: Secondary | ICD-10-CM

## 2023-12-08 IMAGING — MG MM BREAST LOCALIZATION CLIP
4 series · 4 of 12 positions shown · non-contrast
Comparison: Previous exam(s).

CLINICAL DATA: Evaluate RIBBON biopsy clip placement following
ultrasound-guided LEFT breast biopsy.

EXAM:
3D DIAGNOSTIC LEFT MAMMOGRAM POST ULTRASOUND BIOPSY

[L ML synth-2D]
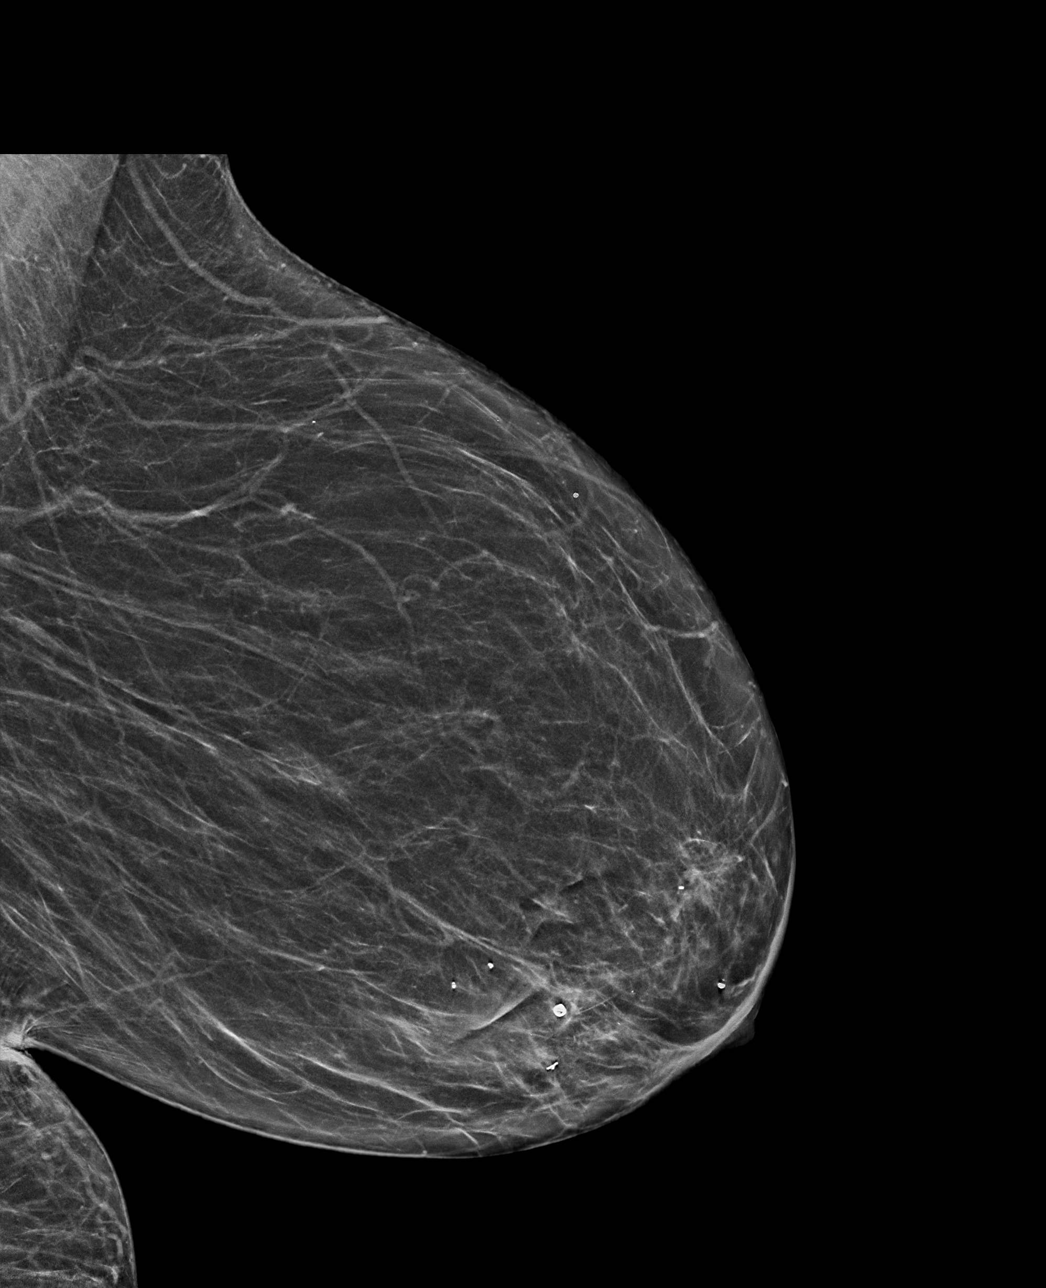

[L CC synth-2D]
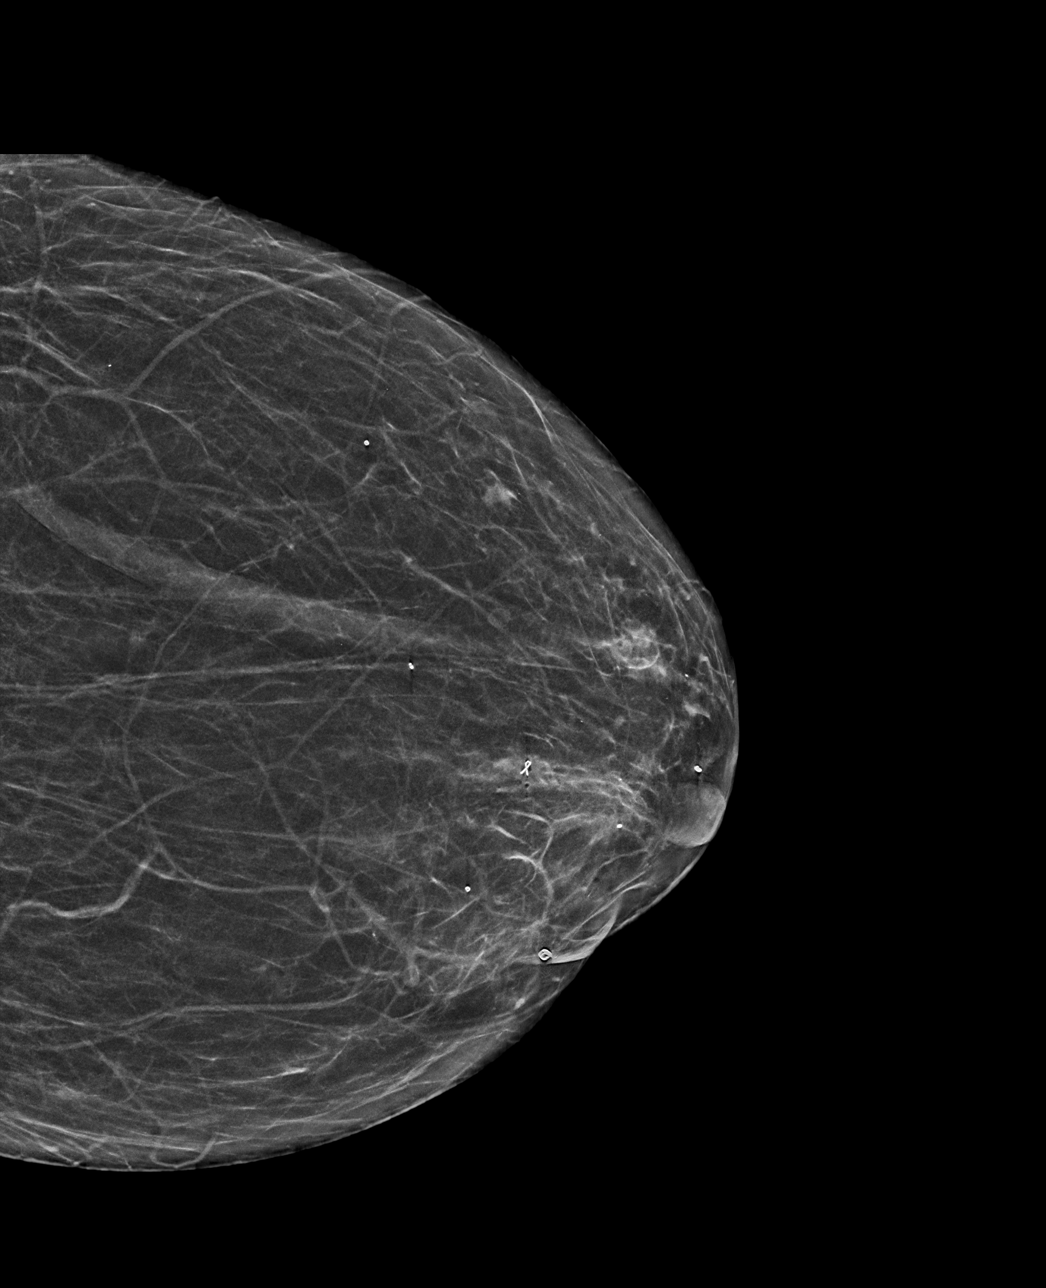

[L ML tomo · tomo slice 31/60.0]
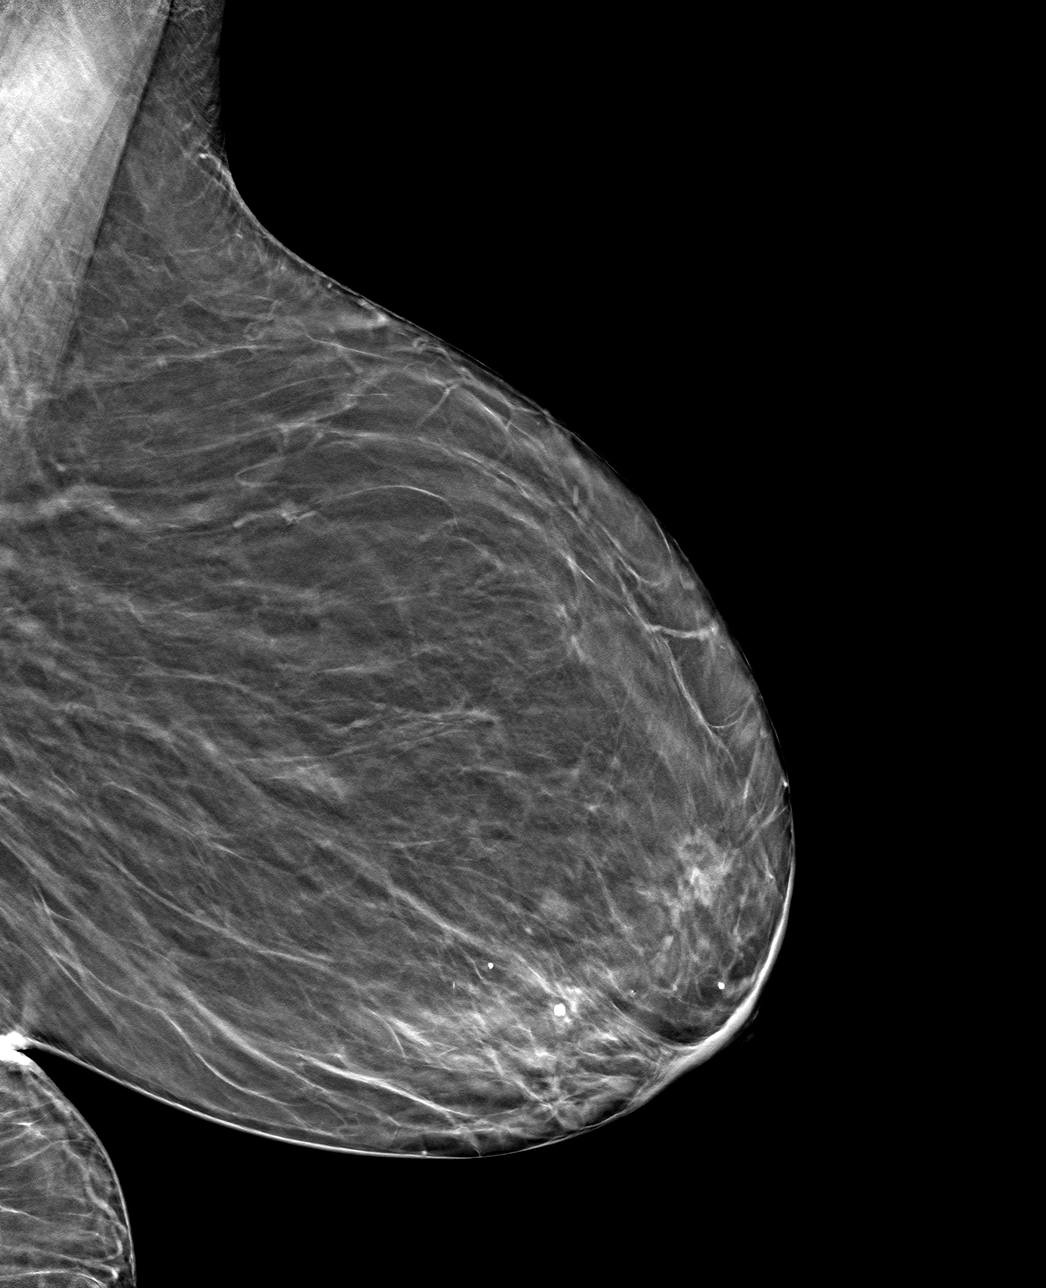

[L CC tomo · tomo slice 27/54.0]
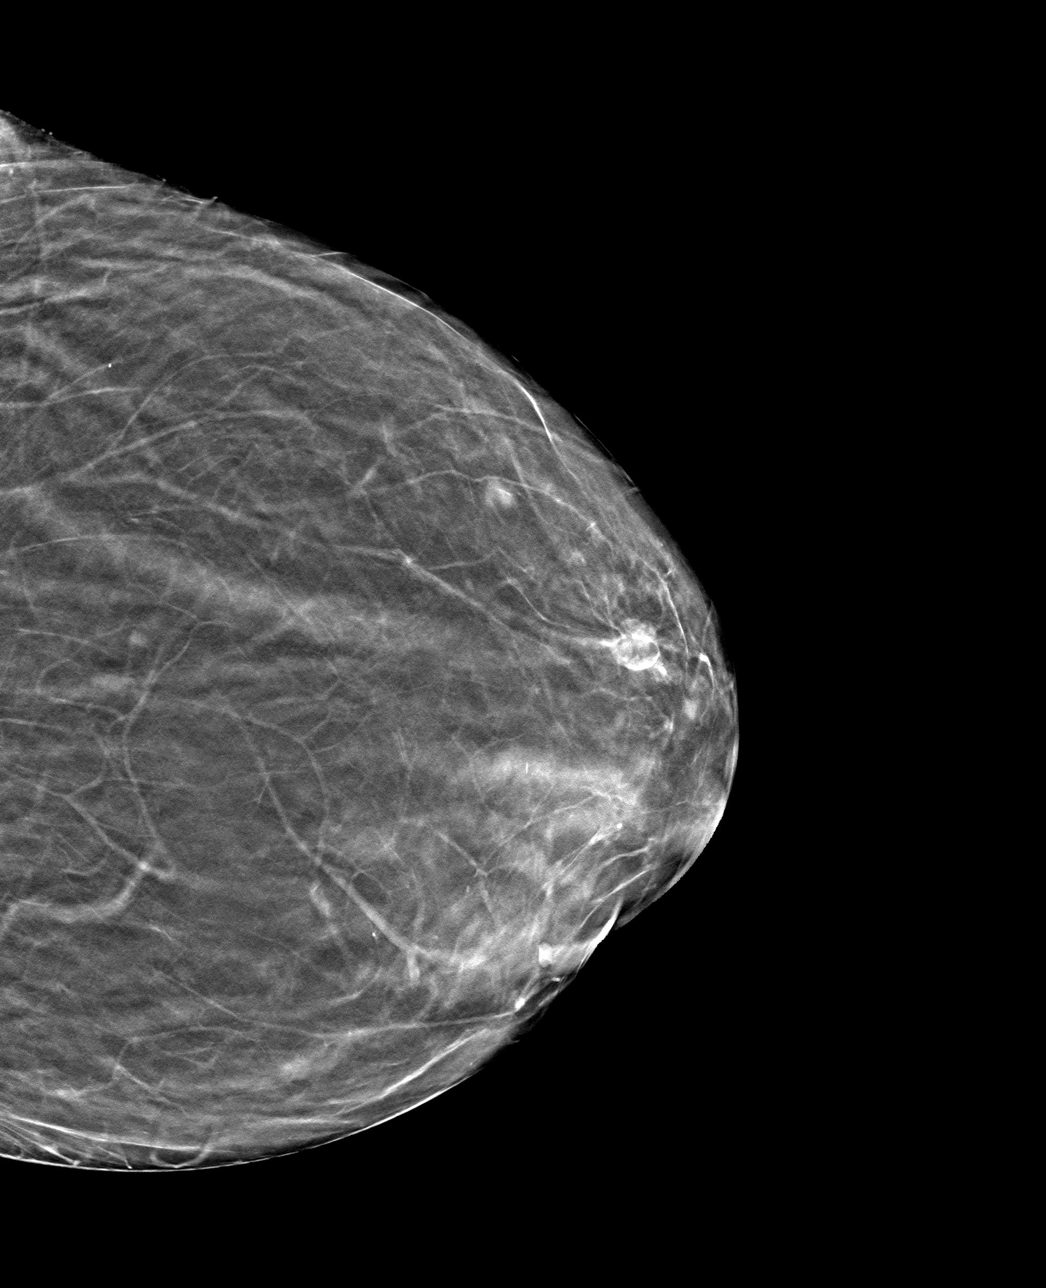

[4 of 12 positions shown; findings below may reference images not displayed]

FINDINGS: 3D Mammographic images were obtained following ultrasound guided
biopsy of 0.7 cm mass at the 6 o'clock position of the LEFT breast 2
cm from the nipple. The RIBBON biopsy marking clip is in expected
position at the site of biopsy.
IMPRESSION: Appropriate positioning of the RIBBON shaped biopsy marking clip at
the site of biopsy in the slightly LOWER LEFT breast.

Final Assessment: Post Procedure Mammograms for Marker Placement

## 2023-12-08 IMAGING — US US BREAST BX W LOC DEV 1ST LESION IMG BX SPEC US GUIDE*L*
1 series · 12 of 12 positions shown · non-contrast
Comparison: Previous exam(s).
COMPARISON: Previous exam(s).

Addendum:
CLINICAL DATA: 70-year-old female for tissue sampling of 0.7 cm
LOWER LEFT breast mass.

EXAM:
ULTRASOUND GUIDED LEFT BREAST CORE NEEDLE BIOPSY

[Series 1: us breast bx w loc dev 1st lesion img bx spec us g · 0.05mm/px · 12 of 12 slices shown]
[im 1/12]
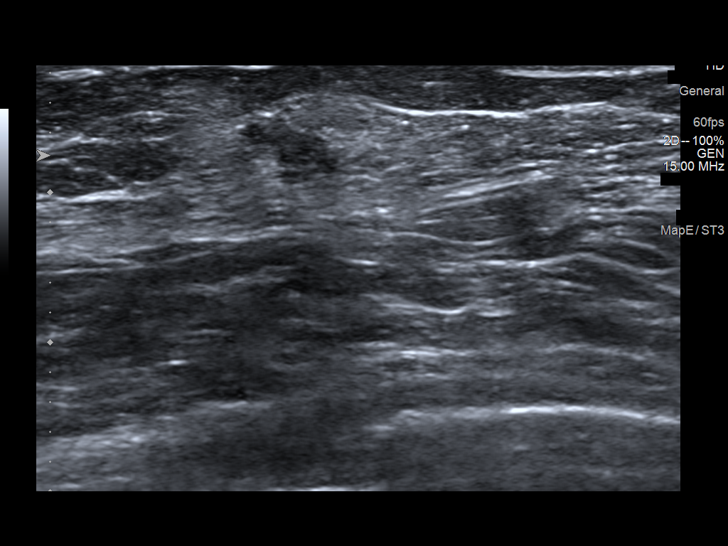
[im 2/12]
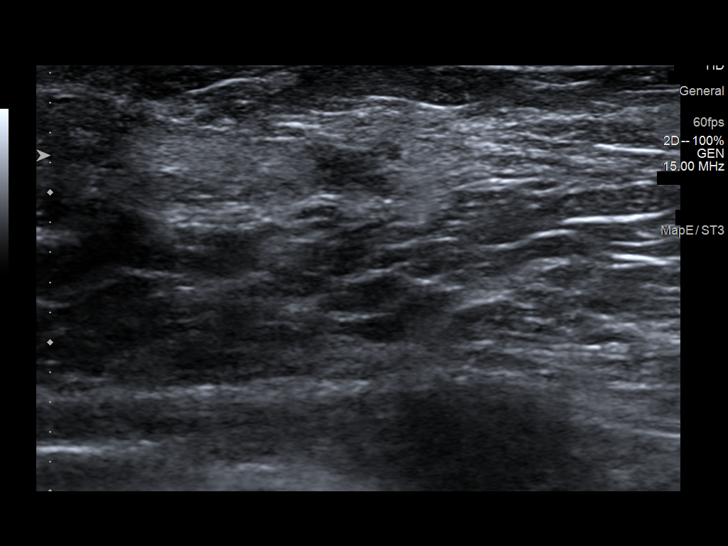
[im 3/12]
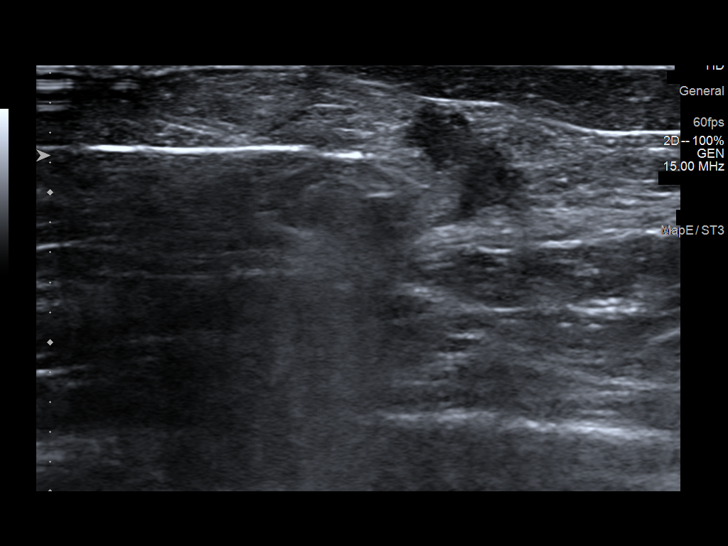
[im 4/12]
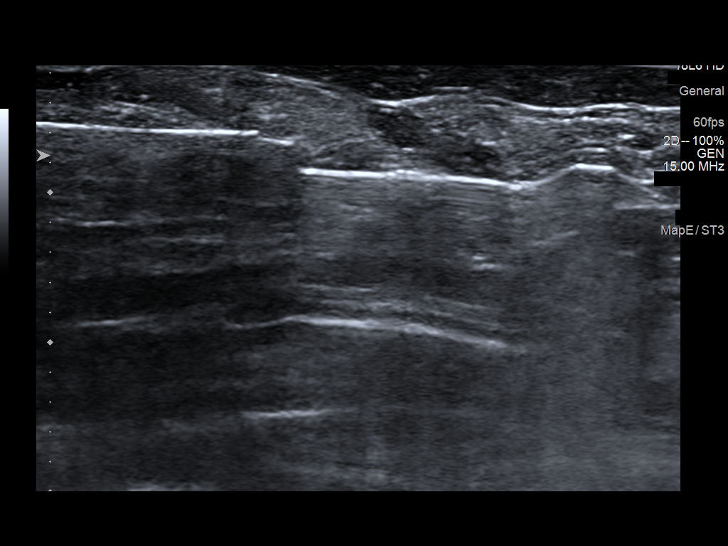
[im 5/12]
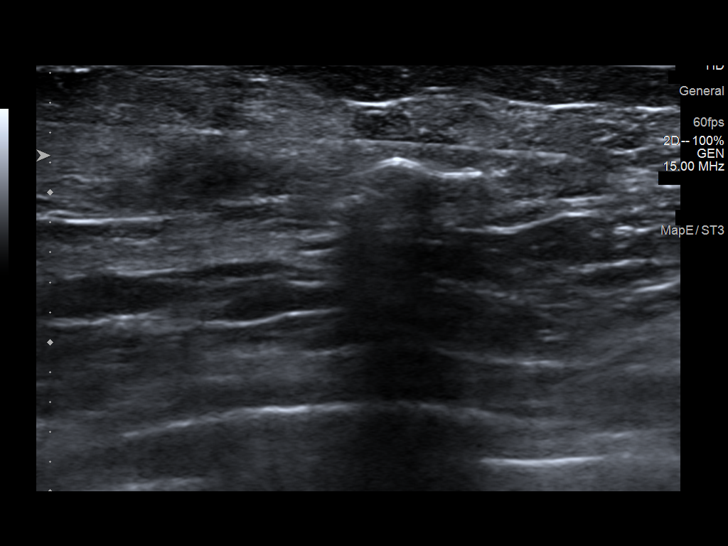
[im 6/12]
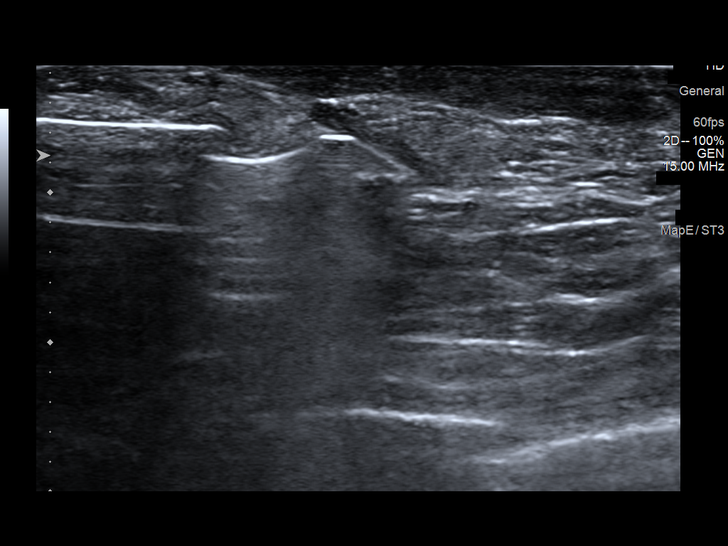
[im 7/12]
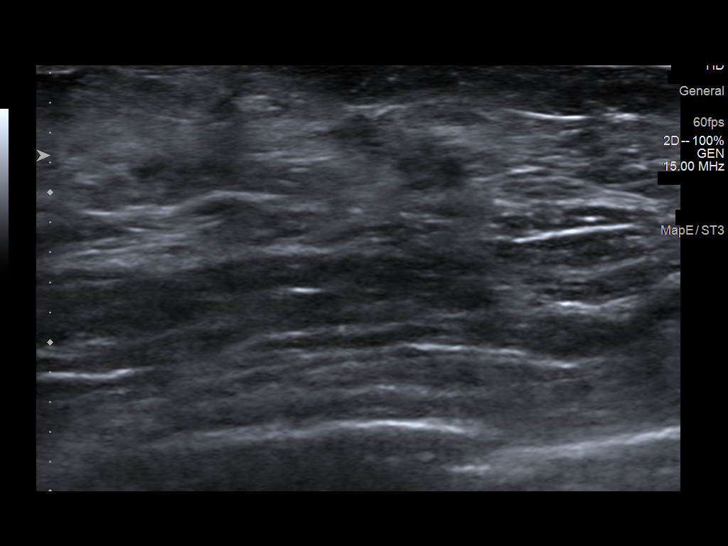
[im 8/12]
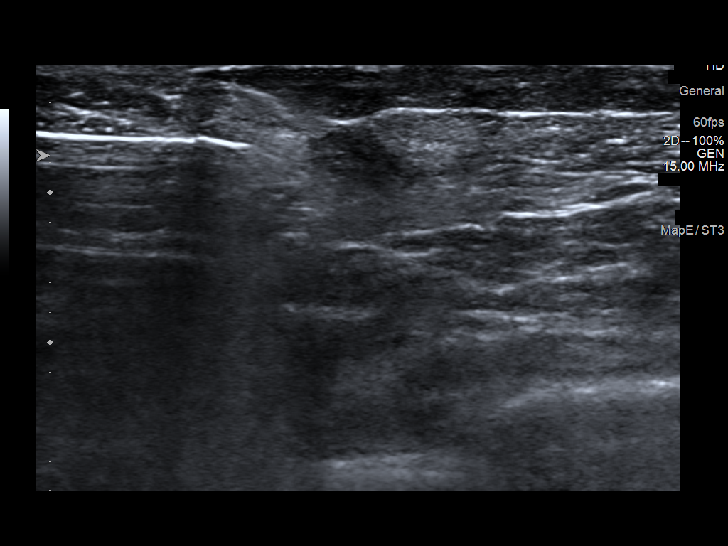
[im 9/12]
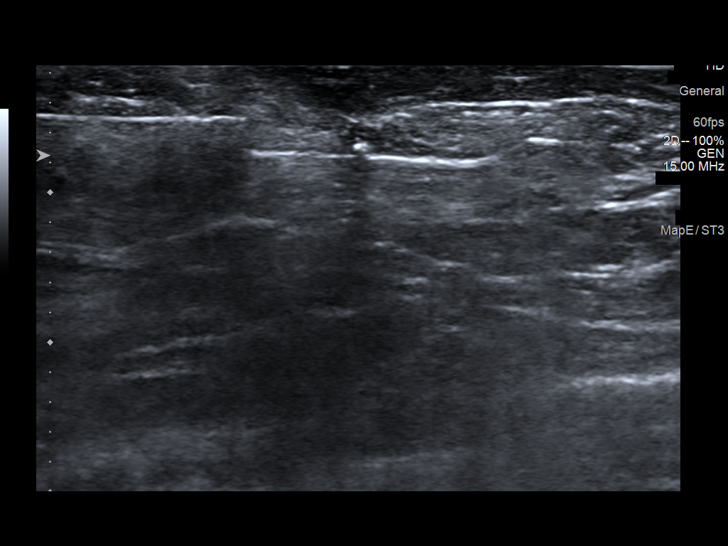
[im 10/12]
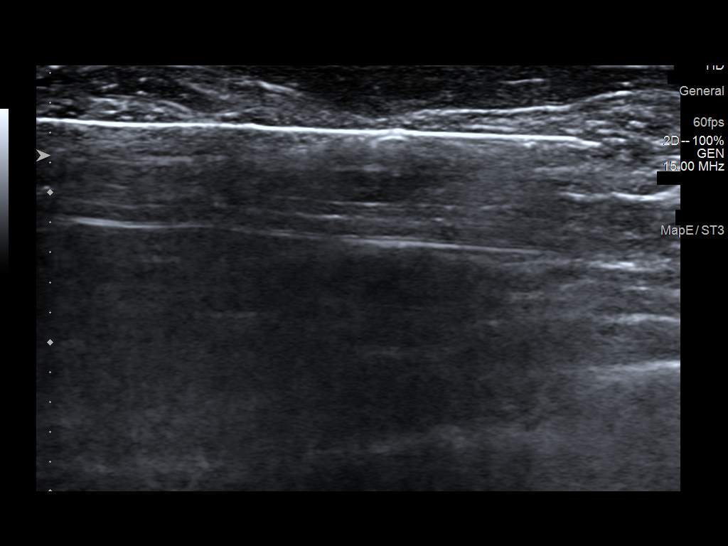
[im 11/12]
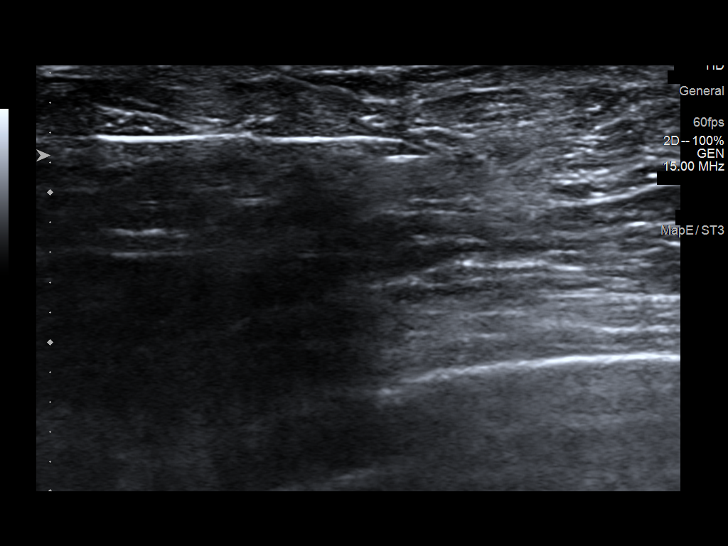
[im 12/12]
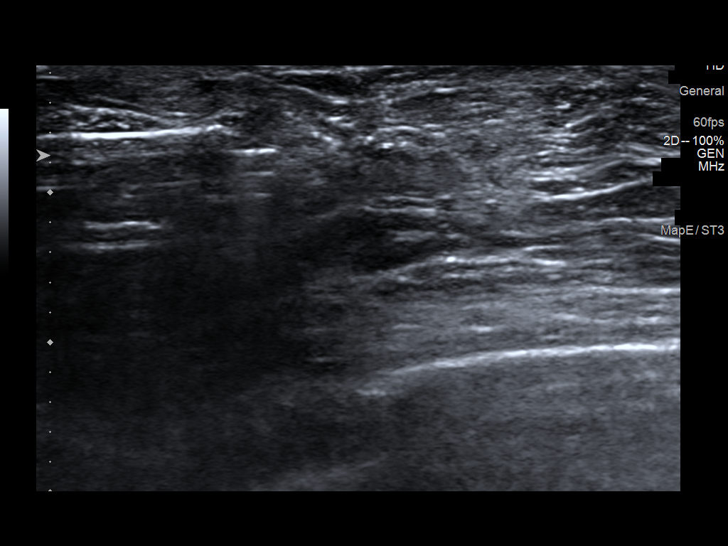

[12 of 12 positions shown; findings below may reference images not displayed]



Using sterile technique and 1% Lidocaine as local anesthetic, under
direct ultrasound visualization, a 12 gauge Popea device was
used to perform biopsy of the 0.7 cm mass at the 6 o'clock position
of the LEFT breast 2 cm from the nipple using a MEDIAL approach. At
the conclusion of the procedure a RIBBON tissue marker clip was
deployed into the biopsy cavity. Follow up 2 view mammogram was
performed and dictated separately.
IMPRESSION: Ultrasound guided biopsy of 0.7 cm LOWER LEFT breast mass. No
apparent complications.

ADDENDUM:
Pathology revealed FAT NECROSIS WITH FIBROSIS AND HEMOSIDERIN
DEPOSITS of the LEFT breast, 6 o'clock, 3cmfn, lower, (ribbon clip).
This was found to be concordant by Dr. Isabelle Goad.

Pathology results were discussed with the patient by telephone. The
patient reported doing well after the biopsy with minimal tenderness
at the site. Post biopsy instructions and care were reviewed and
questions were answered. The patient was encouraged to call The
direct phone number was provided.

The patient was instructed to return for annual screening
mammography in June 2022.

Pathology results reported by Amrita Cantillo, RN on 08/10/2021.



Using sterile technique and 1% Lidocaine as local anesthetic, under
direct ultrasound visualization, a 12 gauge Popea device was
used to perform biopsy of the 0.7 cm mass at the 6 o'clock position
of the LEFT breast 2 cm from the nipple using a MEDIAL approach. At
the conclusion of the procedure a RIBBON tissue marker clip was
deployed into the biopsy cavity. Follow up 2 view mammogram was
performed and dictated separately.
IMPRESSION: Ultrasound guided biopsy of 0.7 cm LOWER LEFT breast mass. No
apparent complications.

## 2024-03-21 ENCOUNTER — Encounter (INDEPENDENT_AMBULATORY_CARE_PROVIDER_SITE_OTHER): Payer: Self-pay

## 2024-04-24 ENCOUNTER — Institutional Professional Consult (permissible substitution) (INDEPENDENT_AMBULATORY_CARE_PROVIDER_SITE_OTHER): Admitting: Physician Assistant

## 2024-05-06 ENCOUNTER — Ambulatory Visit (INDEPENDENT_AMBULATORY_CARE_PROVIDER_SITE_OTHER): Admitting: Physician Assistant

## 2024-05-06 ENCOUNTER — Encounter (INDEPENDENT_AMBULATORY_CARE_PROVIDER_SITE_OTHER): Payer: Self-pay | Admitting: Physician Assistant

## 2024-05-06 VITALS — BP 138/82 | HR 85 | Ht 62.0 in | Wt 180.0 lb

## 2024-05-06 MED ORDER — MUPIROCIN 2 % EX OINT: 1.0000 | TOPICAL_OINTMENT | Freq: Two times a day (BID) | CUTANEOUS | 0 refills | Status: AC

## 2024-05-06 MED ORDER — FLUTICASONE PROPIONATE 50 MCG/ACT NA SUSP: 2.0000 | Freq: Every day | NASAL | 6 refills | Status: AC

## 2024-05-06 NOTE — Patient Instructions (Signed)
 Use Flonase , antihistamine, and saline irrigation for three months.

## 2024-05-07 NOTE — Progress Notes (Signed)
 Dear Dr. Gerome, Here is my assessment for our mutual patient, Mary Berger. Thank you for allowing me the opportunity to care for your patient. Please do not hesitate to contact me should you have any other questions. Sincerely, Chyrl Cohen PA-C  Otolaryngology Clinic Note Referring provider: Dr. Gerome HPI:  Mary Berger is a 73 y.o. female kindly referred by Dr. Gerome   Discussed the use of AI scribe software for clinical note transcription with the patient, who gave verbal consent to proceed.  History of Present Illness   Mary Berger is a 73 year old female with chronic sinus issues who presents with worsening sinus pain and nasal sores.  She has a 25-year history of sinus problems, typically managed with antibiotics, which previously resolved her symptoms temporarily. Over the past year, her sinus issues have worsened, with two courses of antibiotics, including a Z-Pak and another starting with 'A', failing to alleviate her symptoms. She describes her current symptoms as throbbing facial pain, primarily in the nose, with associated nasal sores that alternate between nostrils. The sores are exacerbated by blowing her nose, which disrupts healing. No pressure in her forehead or cheeks. No history of seasonal allergies or nasal trauma. She has seen an ENT in the past, approximately 20 years ago, and was treated with prednisone at that time.  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 She experiences increased neck pain when lying down. No difficulty breathing through her nose but emphasizes that the pain is significant and worsening. She has experienced nasal sores about three times over the past year, which clear up and then recur.  She has a history of tonsillectomy at age 75 and has experienced a loss of taste, which has not fully returned.  Additionally, she experiences severe reflux, which has  worsened over time. Initially managed with 20 mg of famotidine, her dose was increased to 40 mg, but she finds it insufficient and supplements with half doses of another medication. Reflux occurs after every meal.           Independent Review of Additional Tests or Records:  non   PMH/Meds/All/SocHx/FamHx/ROS:   Past Medical History:  Diagnosis Date   Fibromyalgia    Hx of adenomatous colonic polyps 02/24/2015   Hypertension    Insomnia      Past Surgical History:  Procedure Laterality Date   ABDOMINAL HYSTERECTOMY     CESAREAN SECTION     LAPAROSCOPIC GASTRIC BANDING     later removed   LAPAROSCOPIC GASTRIC SLEEVE RESECTION      Family History  Problem Relation Age of Onset   Colon polyps Neg Hx    Colon cancer Neg Hx    Breast cancer Neg Hx      Social Connections: Not on file     Current Medications[1]   Physical Exam:   BP 138/82   Pulse 85   Ht 5' 2 (1.575 m)   Wt 180 lb (81.6 kg)   SpO2 97%   BMI 32.92 kg/m   Pertinent Findings  CN II-XII grossly intact Left EAC with cerumen impaction right EAC clear, TM intact well-pneumatized middle ear space Anterior rhinoscopy: Septum left deviation; right inferior turbinate with mild hypertrophy, left with no significant hypertrophy, small amount of crusting along the left nare No lesions of oral cavity/oropharynx; dentition wnl No obviously palpable neck masses/lymphadenopathy/thyromegaly No respiratory distress or stridor  Seprately Identifiable Procedures:  Procedure: bilateral ear microscopy and cerumen removal using microscope (CPT [phone]) - Mod 25 Pre-procedure diagnosis: unilateral cerumen impaction left external auditory canal Post-procedure diagnosis: same Indication: bilateral cerumen impaction; given patient's otologic complaints and history as well as for improved and comprehensive examination of external ear and tympanic membrane, bilateral otologic examination using microscope was performed  and impacted cerumen removed  Procedure: Patient was placed semi-recumbent. Both ear canals were examined using the microscope with findings above. Cerumen removed from the left external auditory canal using suction and currette with improvement in EAC examination and patency. Left: EAC was patent. TM was intact . Middle ear was aerated. Drainage: none Right: EAC was patent. TM was intact . Middle ear was aerated . Drainage: none Patient tolerated the procedure well.   PROCEDURE: Bilateral Diagnostic Nasal Endoscopy Pre-procedure diagnosis: Concern for nasal congestion Post-procedure diagnosis: same Indication: See pre-procedure diagnosis and physical exam above Complications: None apparent EBL: 0 mL Anesthesia: Lidocaine 4% and topical decongestant was topically sprayed in each nasal cavity  Description of Procedure:  Patient was identified. A rigid 30 degree endoscope was utilized to evaluate the sinonasal cavities, mucosa, sinus ostia and turbinates and septum.  Overall, signs of mucosal inflammation are noted.  Also noted are turbinate hypertrophy on the right.  No mucopurulence, polyps, or masses noted.   Right Middle meatus: Clear Right SE Recess: Clear Left MM: Clear Left SE Recess: Clear Photodocumentation was obtained.  CPT CODE -- [id number] - Mod 25   Impression & Plans:  Mary Berger is a 73 y.o. female with the following   Assessment and Plan    Chronic sinusitis with deviated septum and turbinate hypertrophy Chronic sinusitis with exacerbation, deviated septum, and turbinate hypertrophy. No malignancy evidence. - Start daily antihistamine such as Claritin or cetirizine. - Use Flonase  nasal spray daily. - Perform saline irrigation of sinuses. - Apply mupirocin  ointment in both nostrils for 5-7 days. - Follow up in 3 months to assess treatment efficacy.           - f/u 22-month follow-up   Thank you for allowing me the opportunity to care for your patient.  Please do not hesitate to contact me should you have any other questions.  Sincerely, Chyrl Cohen PA-C [facility] Phone: [phone] Fax: [phone]  05/07/2024, 1:24 PM        [1]  Current Outpatient Medications:    fluticasone  (FLONASE ) 50 MCG/ACT nasal spray, Place 2 sprays into both nostrils daily., Disp: 16 g, Rfl: 6   mupirocin  ointment (BACTROBAN ) 2 %, Apply 1 Application topically 2 (two) times daily for 7 days., Disp: 14 g, Rfl: 0   amLODipine (NORVASC) 10 MG tablet, Take 10 mg by mouth daily., Disp: , Rfl:    buPROPion (WELLBUTRIN SR) 150 MG 12 hr tablet, Take 150 mg by mouth 2 (two) times daily., Disp: , Rfl:    CHONDROITIN SULFATE A PO, Take by mouth 2 (two) times daily., Disp: , Rfl:    cloNIDine (CATAPRES) 0.1 MG tablet, Take 0.1 mg by mouth 2 (two) times daily., Disp: , Rfl:    DULoxetine (CYMBALTA) 30 MG capsule, Take 30 mg by mouth daily., Disp: , Rfl:    hydrochlorothiazide (HYDRODIURIL) 25 MG tablet, Take 25 mg by mouth daily., Disp: , Rfl:    HYDROmorphone  (DILAUDID ) 4 MG tablet, Take 1 tablet (4 mg total) by mouth 5 (five) times daily as needed for pain, Disp: 150 tablet, Rfl: 0   HYDROmorphone  (  DILAUDID ) 4 MG tablet, Take 1 tablet (4 mg total) by mouth 5 (five) times daily as needed for pain, Disp: 30 tablet, Rfl: 0   HYDROmorphone  (DILAUDID ) 4 MG tablet, Take 1 tablet (4 mg total) by mouth 5 (five) times daily as needed for pain (stop MSIR), Disp: 150 tablet, Rfl: 0   HYDROmorphone  (DILAUDID ) 4 MG tablet, Take 1 tablet by mouth 5 times a day as needed for pain --stop MSIR, Disp: 150 tablet, Rfl: 0   HYDROmorphone  (DILAUDID ) 4 MG tablet, Take 1 tablet (4 mg total) by mouth 5 (five) times daily as needed for pain.  **Stop Morphine**, Disp: 150 tablet, Rfl: 0   HYDROmorphone  (DILAUDID ) 4 MG tablet, Take 1 tablet (4 mg total) by mouth 5 (five) times daily as needed for pain., Disp: 150 tablet, Rfl: 0   methocarbamol (ROBAXIN) 500 MG tablet, Take 500  mg by mouth 4 (four) times daily., Disp: , Rfl:    MORPHINE SULFATE IR PO, Take by mouth., Disp: , Rfl:    Multiple Vitamin (MULTIVITAMIN) tablet, Take 1 tablet by mouth daily., Disp: , Rfl:    OVER THE COUNTER MEDICATION, b c powder, Disp: , Rfl:    permethrin  (ELIMITE ) 5 % cream, Apply to affected area once, Disp: 60 g, Rfl: 0   zolpidem (AMBIEN) 10 MG tablet, Take 10 mg by mouth at bedtime as needed for sleep., Disp: , Rfl:

## 2024-08-04 ENCOUNTER — Ambulatory Visit (INDEPENDENT_AMBULATORY_CARE_PROVIDER_SITE_OTHER): Admitting: Physician Assistant
